# Patient Record
Sex: Male | Born: 1935 | Race: White | Hispanic: No | Marital: Married
Health system: Southern US, Community
[De-identification: ages and names within clinical notes are randomized; demographics above are authoritative.]

---

## 2008-06-22 ENCOUNTER — Ambulatory Visit: Payer: Self-pay | Admitting: Urology

## 2008-06-28 ENCOUNTER — Ambulatory Visit: Payer: Self-pay | Admitting: Urology

## 2008-07-04 ENCOUNTER — Ambulatory Visit: Payer: Self-pay | Admitting: Radiation Oncology

## 2009-09-10 IMAGING — CT CT ABDOMEN AND PELVIS WITHOUT AND WITH CONTRAST
1 of 5 series · 8 of 32 positions shown, 13 images · non-contrast
Comparison: none

REASON FOR EXAM: prostate CA
COMMENTS:

[Series 6: soft tissue delay · axial · delayed · 0.81mm/px · z∈[-155,+245]mm · 8 of 104 slices shown, 13 images]
[im 12/104  soft-tissue]
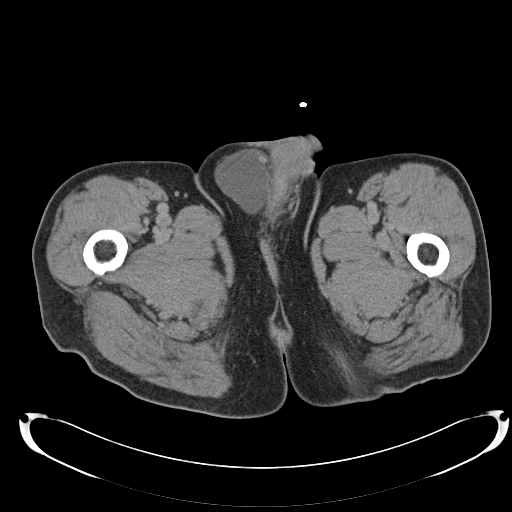
[im 12/104  bone]
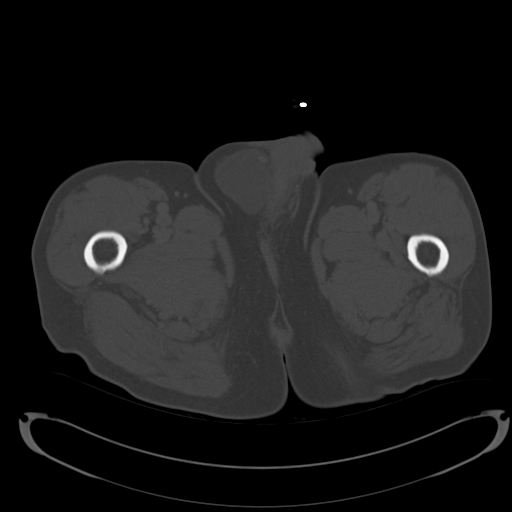
[im 23/104  soft-tissue]
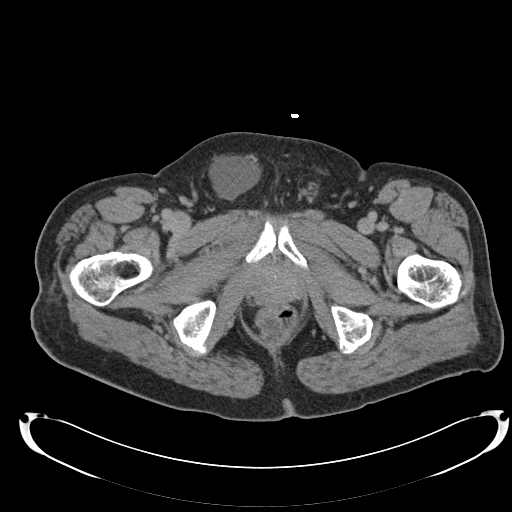
[im 35/104  soft-tissue]
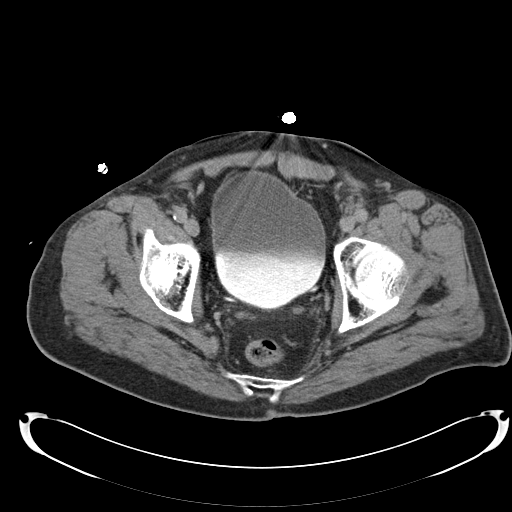
[im 46/104  soft-tissue]
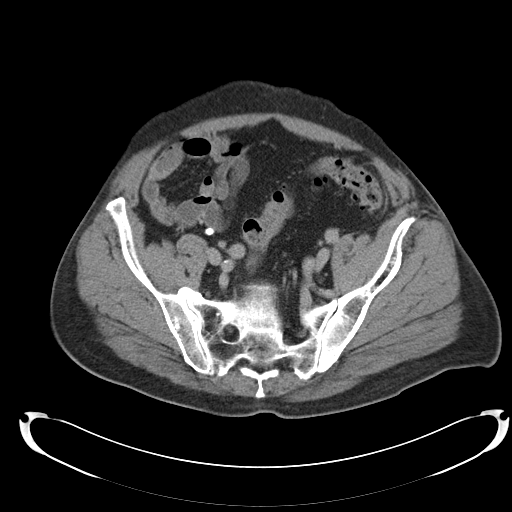
[im 58/104  soft-tissue]
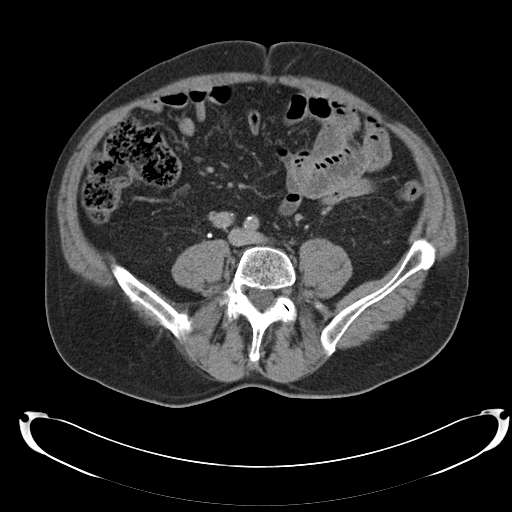
[im 58/104  lung]
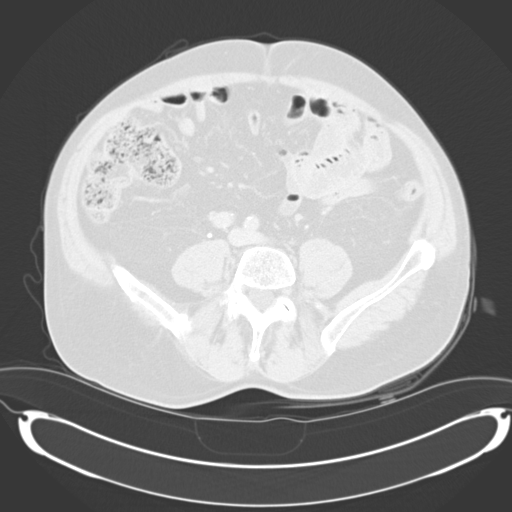
[im 69/104  soft-tissue]
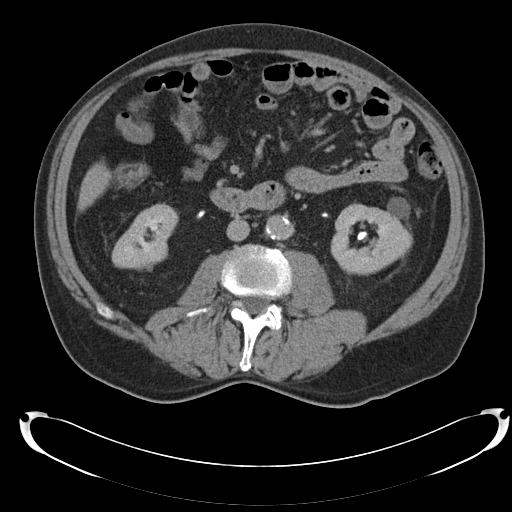
[im 69/104  lung]
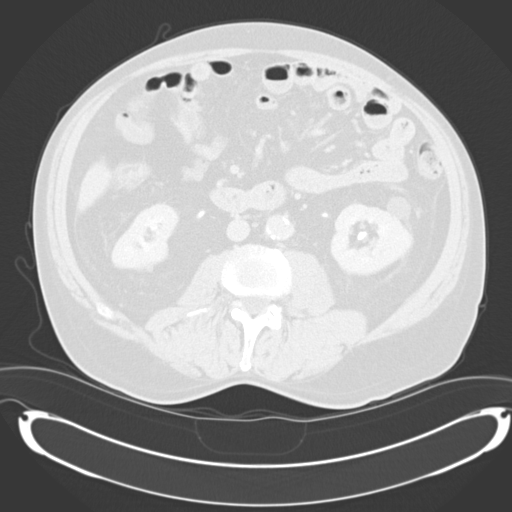
[im 81/104  soft-tissue]
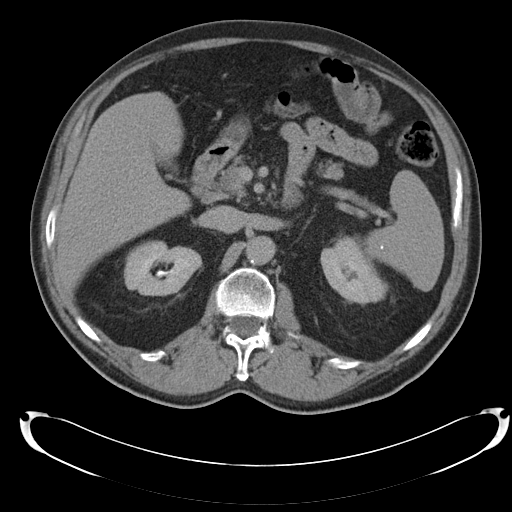
[im 81/104  lung]
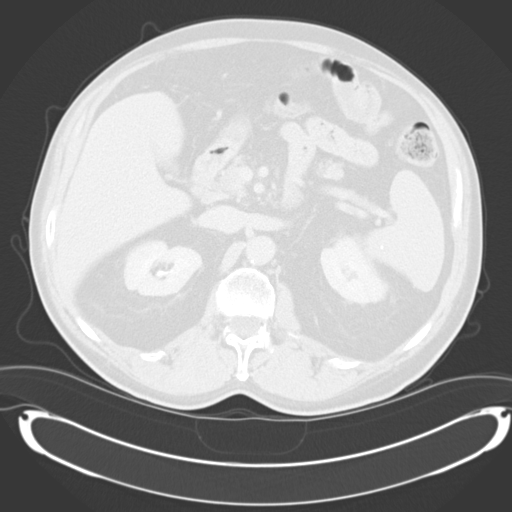
[im 92/104  soft-tissue]
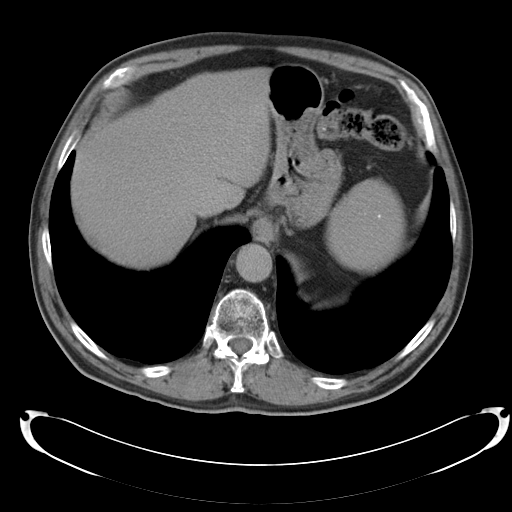
[im 92/104  lung]
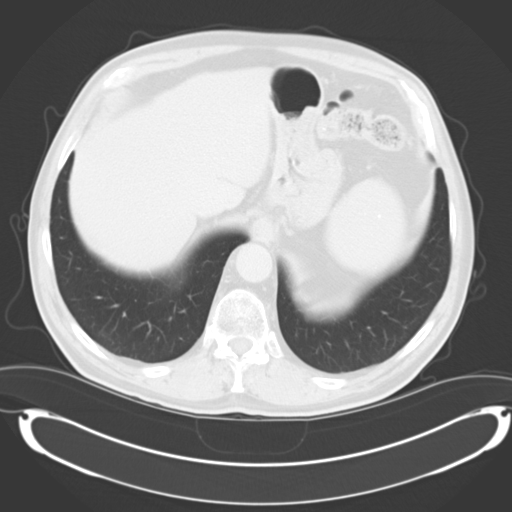

[8 of 32 positions shown; findings below may reference images not displayed]

PROCEDURE:     CT  - CT ABDOMEN / PELVIS  W/WO  - June 22, 2008  [DATE]

RESULT:     Triphasic CT of the abdomen and pelvis is performed. The patient
received a dose of 100 ml of 3sovue-ODM iodinated intravenous contrast for
the exam. Images are reconstructed in the axial plane at 5 mm slice
thickness.

Images through the base of the lungs demonstrate some respiratory motion
artifact. There is mild emphysematous lung disease. There is no mass,
infiltrate or nodule.

Non-contrast images demonstrate multiple punctate granulomatous
calcifications diffusely through the spleen. There appears to be an
exophytic low attenuation area in the midportion of the LEFT kidney
anteriorly. Some perinephric stranding is present bilaterally.
Atherosclerotic calcification is present in the aorta and its branches. The
urinary bladder is unremarkable. Diverticulosis is present. The appendix
appears normal. Following contrast administration the kidneys enhance
symmetrically. There is no obstruction or definite solid mass. There does
appear to be a cyst in the LEFT kidney just below the mid level anteriorly
in an exophytic location measuring up to approximately 2 cm in maximal
diameter. There is no retroperitoneal adenopathy. The aorta is normal in
caliber. There is no evidence of inguinal or pelvic adenopathy. There does
appear to be a moderately large RIGHT hydrocele. No hepatic lesions are
evident. The pancreas is unremarkable. There are no stones in the
gallbladder.

On delayed images both kidneys excrete contrast opacified urine. The ureters
opacify intermittently to the bladder which shows no definite filling
defect. The prostate shows mild enlargement. Calcification is noted in the
prostate.
IMPRESSION: 1. No evidence of adenopathy.
2. Mild emphysematous lung diseases at the lung bases.
3. LEFT renal cyst. The appendix is unremarkable.
4. Diverticulosis without definite CT evidence of acute diverticulitis.
5. RIGHT hydrocele.

## 2013-03-09 ENCOUNTER — Ambulatory Visit: Payer: Self-pay | Admitting: Oncology

## 2013-04-06 ENCOUNTER — Ambulatory Visit: Payer: Self-pay | Admitting: Oncology

## 2013-04-06 LAB — BASIC METABOLIC PANEL WITH GFR
Anion Gap: 9
BUN: 15 mg/dL
Calcium, Total: 8.4 mg/dL — ABNORMAL LOW
Chloride: 101 mmol/L
Co2: 32 mmol/L
Creatinine: 1.12 mg/dL
EGFR (African American): >60
EGFR (Non-African Amer.): >60
Glucose: 94 mg/dL
Osmolality: 284
Potassium: 2.9 mmol/L — ABNORMAL LOW
Sodium: 142 mmol/L

## 2013-04-06 LAB — CBC CANCER CENTER
Basophil #: 0 "x10 3/mm "
Basophil %: 0.5 %
Eosinophil #: 0 "x10 3/mm "
Eosinophil %: 2.1 %
HCT: 38.7 % — ABNORMAL LOW
HGB: 13 g/dL
Lymphocyte %: 21.2 %
Lymphs Abs: 0.5 "x10 3/mm " — ABNORMAL LOW
MCH: 31.7 pg
MCHC: 33.4 g/dL
MCV: 95 fL
Monocyte #: 0.3 "x10 3/mm "
Monocyte %: 12.2 %
Neutrophil #: 1.5 "x10 3/mm "
Neutrophil %: 64 %
Platelet: 133 "x10 3/mm " — ABNORMAL LOW
RBC: 4.09 "x10 6/mm " — ABNORMAL LOW
RDW: 16.7 % — ABNORMAL HIGH
WBC: 2.3 "x10 3/mm " — ABNORMAL LOW

## 2013-04-07 LAB — URINE IEP, RANDOM

## 2013-04-07 LAB — KAPPA/LAMBDA FREE LIGHT CHAINS (ARMC)

## 2013-04-07 LAB — PROT IMMUNOELECTROPHORES(ARMC)

## 2013-05-01 ENCOUNTER — Ambulatory Visit: Payer: Self-pay | Admitting: Oncology

## 2013-05-25 LAB — CBC CANCER CENTER
Basophil #: 0 x10 3/mm (ref 0.0–0.1)
Basophil %: 0.6 %
Eosinophil %: 2.1 %
HCT: 37.3 % — ABNORMAL LOW (ref 40.0–52.0)
HGB: 12.4 g/dL — ABNORMAL LOW (ref 13.0–18.0)
Lymphocyte #: 0.4 x10 3/mm — ABNORMAL LOW (ref 1.0–3.6)
MCHC: 33.3 g/dL (ref 32.0–36.0)
Monocyte #: 0.2 x10 3/mm (ref 0.2–1.0)
Monocyte %: 11.7 %
Neutrophil #: 1.2 x10 3/mm — ABNORMAL LOW (ref 1.4–6.5)
Neutrophil %: 62.3 %
Platelet: 118 x10 3/mm — ABNORMAL LOW (ref 150–440)
WBC: 1.8 x10 3/mm — CL (ref 3.8–10.6)

## 2013-05-25 LAB — BASIC METABOLIC PANEL
Anion Gap: 9 (ref 7–16)
Chloride: 103 mmol/L (ref 98–107)
Co2: 28 mmol/L (ref 21–32)
EGFR (African American): >60
Glucose: 98 mg/dL (ref 65–99)

## 2013-05-26 LAB — KAPPA/LAMBDA FREE LIGHT CHAINS (ARMC)

## 2013-05-26 LAB — PROT IMMUNOELECTROPHORES(ARMC)

## 2013-05-31 ENCOUNTER — Ambulatory Visit: Payer: Self-pay | Admitting: Oncology

## 2013-06-28 LAB — CBC CANCER CENTER
Eosinophil #: 0.1 x10 3/mm (ref 0.0–0.7)
Eosinophil %: 1.7 %
HCT: 35.9 % — ABNORMAL LOW (ref 40.0–52.0)
HGB: 12.2 g/dL — ABNORMAL LOW (ref 13.0–18.0)
Lymphocyte #: 0.4 x10 3/mm — ABNORMAL LOW (ref 1.0–3.6)
Lymphocyte %: 12.3 %
MCHC: 34 g/dL (ref 32.0–36.0)
MCV: 95 fL (ref 80–100)
Monocyte %: 14.7 %
Neutrophil %: 70.8 %
RBC: 3.78 10*6/uL — ABNORMAL LOW (ref 4.40–5.90)
RDW: 15.1 % — ABNORMAL HIGH (ref 11.5–14.5)

## 2013-06-28 LAB — BASIC METABOLIC PANEL
Anion Gap: 2 — ABNORMAL LOW (ref 7–16)
BUN: 19 mg/dL — ABNORMAL HIGH (ref 7–18)
Co2: 30 mmol/L (ref 21–32)
Creatinine: 0.93 mg/dL (ref 0.60–1.30)
EGFR (Non-African Amer.): >60
Glucose: 95 mg/dL (ref 65–99)
Sodium: 139 mmol/L (ref 136–145)

## 2013-06-29 LAB — PROT IMMUNOELECTROPHORES(ARMC)

## 2013-07-01 ENCOUNTER — Ambulatory Visit: Payer: Self-pay | Admitting: Oncology

## 2013-07-20 LAB — CBC CANCER CENTER
BASOS PCT: 1 %
Basophil #: 0 x10 3/mm (ref 0.0–0.1)
Eosinophil #: 0.2 x10 3/mm (ref 0.0–0.7)
Eosinophil %: 12.4 %
HCT: 37 % — AB (ref 40.0–52.0)
HGB: 12.5 g/dL — AB (ref 13.0–18.0)
LYMPHS PCT: 28.6 %
Lymphocyte #: 0.5 x10 3/mm — ABNORMAL LOW (ref 1.0–3.6)
MCH: 32.1 pg (ref 26.0–34.0)
MCHC: 33.8 g/dL (ref 32.0–36.0)
MCV: 95 fL (ref 80–100)
MONO ABS: 0.3 x10 3/mm (ref 0.2–1.0)
MONOS PCT: 17.6 %
NEUTROS PCT: 40.4 %
Neutrophil #: 0.7 x10 3/mm — ABNORMAL LOW (ref 1.4–6.5)
Platelet: 75 x10 3/mm — ABNORMAL LOW (ref 150–440)
RBC: 3.89 10*6/uL — AB (ref 4.40–5.90)
RDW: 15.3 % — AB (ref 11.5–14.5)
WBC: 1.8 x10 3/mm — CL (ref 3.8–10.6)

## 2013-07-20 LAB — BASIC METABOLIC PANEL
Anion Gap: 9 (ref 7–16)
BUN: 12 mg/dL (ref 7–18)
CALCIUM: 7.8 mg/dL — AB (ref 8.5–10.1)
CO2: 29 mmol/L (ref 21–32)
Chloride: 104 mmol/L (ref 98–107)
Creatinine: 0.97 mg/dL (ref 0.60–1.30)
EGFR (Non-African Amer.): >60
GLUCOSE: 82 mg/dL (ref 65–99)
Osmolality: 282 (ref 275–301)
Potassium: 3.2 mmol/L — ABNORMAL LOW (ref 3.5–5.1)
SODIUM: 142 mmol/L (ref 136–145)

## 2013-07-22 LAB — URINE IEP, RANDOM

## 2013-07-22 LAB — PROT IMMUNOELECTROPHORES(ARMC)

## 2013-07-27 LAB — CBC CANCER CENTER
Basophil #: 0 x10 3/mm (ref 0.0–0.1)
Basophil %: 1.2 %
EOS ABS: 0 x10 3/mm (ref 0.0–0.7)
EOS PCT: 2.1 %
HCT: 35.4 % — ABNORMAL LOW (ref 40.0–52.0)
HGB: 11.9 g/dL — ABNORMAL LOW (ref 13.0–18.0)
Lymphocyte #: 0.6 x10 3/mm — ABNORMAL LOW (ref 1.0–3.6)
Lymphocyte %: 45.5 %
MCH: 32.2 pg (ref 26.0–34.0)
MCHC: 33.5 g/dL (ref 32.0–36.0)
MCV: 96 fL (ref 80–100)
Monocyte #: 0.3 x10 3/mm (ref 0.2–1.0)
Monocyte %: 20.1 %
NEUTROS PCT: 31.1 %
Neutrophil #: 0.4 x10 3/mm — ABNORMAL LOW (ref 1.4–6.5)
Platelet: 90 x10 3/mm — ABNORMAL LOW (ref 150–440)
RBC: 3.69 10*6/uL — ABNORMAL LOW (ref 4.40–5.90)
RDW: 15.2 % — ABNORMAL HIGH (ref 11.5–14.5)
WBC: 1.3 x10 3/mm — AB (ref 3.8–10.6)

## 2013-08-01 ENCOUNTER — Ambulatory Visit: Payer: Self-pay | Admitting: Oncology

## 2013-08-25 LAB — BASIC METABOLIC PANEL
Anion Gap: 9 (ref 7–16)
BUN: 17 mg/dL (ref 7–18)
CHLORIDE: 102 mmol/L (ref 98–107)
CREATININE: 1.14 mg/dL (ref 0.60–1.30)
Calcium, Total: 8 mg/dL — ABNORMAL LOW (ref 8.5–10.1)
Co2: 30 mmol/L (ref 21–32)
EGFR (African American): >60
Glucose: 137 mg/dL — ABNORMAL HIGH (ref 65–99)
OSMOLALITY: 285 (ref 275–301)
Potassium: 3.5 mmol/L (ref 3.5–5.1)
SODIUM: 141 mmol/L (ref 136–145)

## 2013-08-25 LAB — CBC CANCER CENTER
Basophil #: 0 x10 3/mm (ref 0.0–0.1)
Basophil %: 0.8 %
EOS PCT: 1 %
Eosinophil #: 0 x10 3/mm (ref 0.0–0.7)
HCT: 40 % (ref 40.0–52.0)
HGB: 13.4 g/dL (ref 13.0–18.0)
LYMPHS PCT: 30.3 %
Lymphocyte #: 0.6 x10 3/mm — ABNORMAL LOW (ref 1.0–3.6)
MCH: 32.1 pg (ref 26.0–34.0)
MCHC: 33.4 g/dL (ref 32.0–36.0)
MCV: 96 fL (ref 80–100)
MONO ABS: 0.4 x10 3/mm (ref 0.2–1.0)
Monocyte %: 20.1 %
Neutrophil #: 0.9 x10 3/mm — ABNORMAL LOW (ref 1.4–6.5)
Neutrophil %: 47.8 %
PLATELETS: 134 x10 3/mm — AB (ref 150–440)
RBC: 4.16 10*6/uL — ABNORMAL LOW (ref 4.40–5.90)
RDW: 15.6 % — AB (ref 11.5–14.5)
WBC: 1.9 x10 3/mm — AB (ref 3.8–10.6)

## 2013-08-27 LAB — URINE IEP, RANDOM

## 2013-08-29 ENCOUNTER — Ambulatory Visit: Payer: Self-pay | Admitting: Oncology

## 2013-08-30 LAB — PROT IMMUNOELECTROPHORES(ARMC)

## 2013-09-29 ENCOUNTER — Ambulatory Visit: Payer: Self-pay | Admitting: Oncology

## 2013-10-21 LAB — CBC CANCER CENTER
BASOS ABS: 0 x10 3/mm (ref 0.0–0.1)
Basophil %: 0.4 %
Eosinophil #: 0 x10 3/mm (ref 0.0–0.7)
Eosinophil %: 0.9 %
HCT: 39.8 % — AB (ref 40.0–52.0)
HGB: 13.3 g/dL (ref 13.0–18.0)
LYMPHS ABS: 0.7 x10 3/mm — AB (ref 1.0–3.6)
LYMPHS PCT: 36.1 %
MCH: 32.2 pg (ref 26.0–34.0)
MCHC: 33.3 g/dL (ref 32.0–36.0)
MCV: 97 fL (ref 80–100)
Monocyte #: 0.3 x10 3/mm (ref 0.2–1.0)
Monocyte %: 14.7 %
Neutrophil #: 0.9 x10 3/mm — ABNORMAL LOW (ref 1.4–6.5)
Neutrophil %: 47.9 %
PLATELETS: 137 x10 3/mm — AB (ref 150–440)
RBC: 4.12 10*6/uL — ABNORMAL LOW (ref 4.40–5.90)
RDW: 15.5 % — ABNORMAL HIGH (ref 11.5–14.5)
WBC: 2 x10 3/mm — CL (ref 3.8–10.6)

## 2013-10-21 LAB — BASIC METABOLIC PANEL
Anion Gap: 9 (ref 7–16)
BUN: 13 mg/dL (ref 7–18)
CALCIUM: 9.2 mg/dL (ref 8.5–10.1)
Chloride: 103 mmol/L (ref 98–107)
Co2: 29 mmol/L (ref 21–32)
Creatinine: 0.95 mg/dL (ref 0.60–1.30)
EGFR (African American): >60
EGFR (Non-African Amer.): >60
Glucose: 71 mg/dL (ref 65–99)
OSMOLALITY: 280 (ref 275–301)
POTASSIUM: 3.5 mmol/L (ref 3.5–5.1)
Sodium: 141 mmol/L (ref 136–145)

## 2013-10-25 LAB — PROT IMMUNOELECTROPHORES(ARMC)

## 2013-10-25 LAB — URINE IEP, RANDOM

## 2013-10-29 ENCOUNTER — Ambulatory Visit: Payer: Self-pay | Admitting: Oncology

## 2013-10-29 LAB — CBC CANCER CENTER
BASOS ABS: 0 x10 3/mm (ref 0.0–0.1)
BASOS PCT: 0.3 %
EOS ABS: 0 x10 3/mm (ref 0.0–0.7)
Eosinophil %: 0.7 %
HCT: 38.3 % — ABNORMAL LOW (ref 40.0–52.0)
HGB: 13 g/dL (ref 13.0–18.0)
LYMPHS ABS: 0.6 x10 3/mm — AB (ref 1.0–3.6)
Lymphocyte %: 32.1 %
MCH: 33 pg (ref 26.0–34.0)
MCHC: 33.9 g/dL (ref 32.0–36.0)
MCV: 97 fL (ref 80–100)
MONOS PCT: 15.4 %
Monocyte #: 0.3 x10 3/mm (ref 0.2–1.0)
NEUTROS ABS: 1 x10 3/mm — AB (ref 1.4–6.5)
Neutrophil %: 51.5 %
PLATELETS: 136 x10 3/mm — AB (ref 150–440)
RBC: 3.93 10*6/uL — AB (ref 4.40–5.90)
RDW: 16.1 % — ABNORMAL HIGH (ref 11.5–14.5)
WBC: 2 x10 3/mm — CL (ref 3.8–10.6)

## 2013-11-06 ENCOUNTER — Emergency Department: Payer: Self-pay | Admitting: Emergency Medicine

## 2013-11-16 LAB — CBC CANCER CENTER
BASOS ABS: 0 x10 3/mm (ref 0.0–0.1)
BASOS PCT: 0.3 %
Eosinophil #: 0 x10 3/mm (ref 0.0–0.7)
Eosinophil %: 0.6 %
HCT: 35.6 % — ABNORMAL LOW (ref 40.0–52.0)
HGB: 12.2 g/dL — AB (ref 13.0–18.0)
Lymphocyte #: 0.8 x10 3/mm — ABNORMAL LOW (ref 1.0–3.6)
Lymphocyte %: 24.2 %
MCH: 33.2 pg (ref 26.0–34.0)
MCHC: 34.3 g/dL (ref 32.0–36.0)
MCV: 97 fL (ref 80–100)
MONO ABS: 0.8 x10 3/mm (ref 0.2–1.0)
Monocyte %: 22.7 %
NEUTROS ABS: 1.8 x10 3/mm (ref 1.4–6.5)
Neutrophil %: 52.2 %
Platelet: 132 x10 3/mm — ABNORMAL LOW (ref 150–440)
RBC: 3.68 10*6/uL — ABNORMAL LOW (ref 4.40–5.90)
RDW: 16.1 % — ABNORMAL HIGH (ref 11.5–14.5)
WBC: 3.4 x10 3/mm — ABNORMAL LOW (ref 3.8–10.6)

## 2013-11-16 LAB — BASIC METABOLIC PANEL
ANION GAP: 9 (ref 7–16)
BUN: 17 mg/dL (ref 7–18)
CALCIUM: 8.6 mg/dL (ref 8.5–10.1)
CO2: 27 mmol/L (ref 21–32)
Chloride: 106 mmol/L (ref 98–107)
Creatinine: 1.05 mg/dL (ref 0.60–1.30)
EGFR (African American): >60
EGFR (Non-African Amer.): >60
Glucose: 91 mg/dL (ref 65–99)
OSMOLALITY: 284 (ref 275–301)
POTASSIUM: 3.7 mmol/L (ref 3.5–5.1)
SODIUM: 142 mmol/L (ref 136–145)

## 2013-11-29 ENCOUNTER — Ambulatory Visit: Payer: Self-pay | Admitting: Oncology

## 2013-12-07 LAB — CBC CANCER CENTER
BASOS ABS: 0 x10 3/mm (ref 0.0–0.1)
BASOS PCT: 0.4 %
Eosinophil #: 0 x10 3/mm (ref 0.0–0.7)
Eosinophil %: 1.3 %
HCT: 39.5 % — AB (ref 40.0–52.0)
HGB: 13.2 g/dL (ref 13.0–18.0)
Lymphocyte #: 0.7 x10 3/mm — ABNORMAL LOW (ref 1.0–3.6)
Lymphocyte %: 19.4 %
MCH: 33.7 pg (ref 26.0–34.0)
MCHC: 33.4 g/dL (ref 32.0–36.0)
MCV: 101 fL — ABNORMAL HIGH (ref 80–100)
MONO ABS: 0.5 x10 3/mm (ref 0.2–1.0)
Monocyte %: 13.7 %
NEUTROS ABS: 2.3 x10 3/mm (ref 1.4–6.5)
Neutrophil %: 65.2 %
Platelet: 166 x10 3/mm (ref 150–440)
RBC: 3.92 10*6/uL — ABNORMAL LOW (ref 4.40–5.90)
RDW: 15.9 % — ABNORMAL HIGH (ref 11.5–14.5)
WBC: 3.6 x10 3/mm — AB (ref 3.8–10.6)

## 2013-12-07 LAB — BASIC METABOLIC PANEL
Anion Gap: 10 (ref 7–16)
BUN: 19 mg/dL — ABNORMAL HIGH (ref 7–18)
CHLORIDE: 101 mmol/L (ref 98–107)
Calcium, Total: 9.2 mg/dL (ref 8.5–10.1)
Co2: 30 mmol/L (ref 21–32)
Creatinine: 1.12 mg/dL (ref 0.60–1.30)
EGFR (African American): >60
EGFR (Non-African Amer.): >60
GLUCOSE: 81 mg/dL (ref 65–99)
OSMOLALITY: 283 (ref 275–301)
Potassium: 3.5 mmol/L (ref 3.5–5.1)
SODIUM: 141 mmol/L (ref 136–145)

## 2013-12-08 LAB — PROT IMMUNOELECTROPHORES(ARMC)

## 2013-12-09 LAB — URINE IEP, RANDOM

## 2013-12-10 LAB — CBC CANCER CENTER
BASOS ABS: 0 x10 3/mm (ref 0.0–0.1)
Basophil %: 0.3 %
Eosinophil #: 0 x10 3/mm (ref 0.0–0.7)
Eosinophil %: 0.9 %
HCT: 39.4 % — ABNORMAL LOW (ref 40.0–52.0)
HGB: 13.4 g/dL (ref 13.0–18.0)
Lymphocyte #: 0.7 x10 3/mm — ABNORMAL LOW (ref 1.0–3.6)
Lymphocyte %: 19.9 %
MCH: 33.9 pg (ref 26.0–34.0)
MCHC: 33.9 g/dL (ref 32.0–36.0)
MCV: 100 fL (ref 80–100)
MONOS PCT: 12.9 %
Monocyte #: 0.4 x10 3/mm (ref 0.2–1.0)
Neutrophil #: 2.2 x10 3/mm (ref 1.4–6.5)
Neutrophil %: 66 %
Platelet: 132 x10 3/mm — ABNORMAL LOW (ref 150–440)
RBC: 3.95 10*6/uL — AB (ref 4.40–5.90)
RDW: 15.3 % — ABNORMAL HIGH (ref 11.5–14.5)
WBC: 3.4 x10 3/mm — AB (ref 3.8–10.6)

## 2013-12-14 LAB — CBC CANCER CENTER
Basophil #: 0 x10 3/mm (ref 0.0–0.1)
Basophil %: 0.3 %
Eosinophil #: 0 x10 3/mm (ref 0.0–0.7)
Eosinophil %: 0.9 %
HCT: 38.9 % — AB (ref 40.0–52.0)
HGB: 13.3 g/dL (ref 13.0–18.0)
Lymphocyte #: 0.6 x10 3/mm — ABNORMAL LOW (ref 1.0–3.6)
Lymphocyte %: 22.5 %
MCH: 33.8 pg (ref 26.0–34.0)
MCHC: 34.3 g/dL (ref 32.0–36.0)
MCV: 99 fL (ref 80–100)
Monocyte #: 0.4 x10 3/mm (ref 0.2–1.0)
Monocyte %: 15.6 %
NEUTROS PCT: 60.7 %
Neutrophil #: 1.7 x10 3/mm (ref 1.4–6.5)
Platelet: 92 x10 3/mm — ABNORMAL LOW (ref 150–440)
RBC: 3.95 10*6/uL — AB (ref 4.40–5.90)
RDW: 15 % — AB (ref 11.5–14.5)
WBC: 2.9 x10 3/mm — AB (ref 3.8–10.6)

## 2013-12-17 LAB — CBC CANCER CENTER
BASOS PCT: 0.3 %
Basophil #: 0 x10 3/mm (ref 0.0–0.1)
EOS PCT: 0.4 %
Eosinophil #: 0 x10 3/mm (ref 0.0–0.7)
HCT: 39.7 % — ABNORMAL LOW (ref 40.0–52.0)
HGB: 13.4 g/dL (ref 13.0–18.0)
LYMPHS ABS: 0.4 x10 3/mm — AB (ref 1.0–3.6)
LYMPHS PCT: 18.3 %
MCH: 33.5 pg (ref 26.0–34.0)
MCHC: 33.8 g/dL (ref 32.0–36.0)
MCV: 99 fL (ref 80–100)
Monocyte #: 0.5 x10 3/mm (ref 0.2–1.0)
Monocyte %: 21.3 %
NEUTROS ABS: 1.4 x10 3/mm (ref 1.4–6.5)
Neutrophil %: 59.7 %
Platelet: 61 x10 3/mm — ABNORMAL LOW (ref 150–440)
RBC: 4 10*6/uL — ABNORMAL LOW (ref 4.40–5.90)
RDW: 14.5 % (ref 11.5–14.5)
WBC: 2.3 x10 3/mm — AB (ref 3.8–10.6)

## 2013-12-21 LAB — CBC CANCER CENTER
Basophil #: 0 x10 3/mm (ref 0.0–0.1)
Basophil %: 0.4 %
EOS PCT: 0.8 %
Eosinophil #: 0 x10 3/mm (ref 0.0–0.7)
HCT: 36.8 % — ABNORMAL LOW (ref 40.0–52.0)
HGB: 12.5 g/dL — ABNORMAL LOW (ref 13.0–18.0)
LYMPHS ABS: 0.8 x10 3/mm — AB (ref 1.0–3.6)
LYMPHS PCT: 20.4 %
MCH: 33.5 pg (ref 26.0–34.0)
MCHC: 33.8 g/dL (ref 32.0–36.0)
MCV: 99 fL (ref 80–100)
MONO ABS: 0.6 x10 3/mm (ref 0.2–1.0)
Monocyte %: 16.2 %
NEUTROS ABS: 2.5 x10 3/mm (ref 1.4–6.5)
NEUTROS PCT: 62.2 %
PLATELETS: 91 x10 3/mm — AB (ref 150–440)
RBC: 3.72 10*6/uL — ABNORMAL LOW (ref 4.40–5.90)
RDW: 14.7 % — ABNORMAL HIGH (ref 11.5–14.5)
WBC: 4 x10 3/mm (ref 3.8–10.6)

## 2013-12-28 LAB — CBC CANCER CENTER
BASOS ABS: 0 x10 3/mm (ref 0.0–0.1)
BASOS PCT: 0.6 %
Eosinophil #: 0 x10 3/mm (ref 0.0–0.7)
Eosinophil %: 0.8 %
HCT: 38.3 % — ABNORMAL LOW (ref 40.0–52.0)
HGB: 13 g/dL (ref 13.0–18.0)
LYMPHS ABS: 0.5 x10 3/mm — AB (ref 1.0–3.6)
LYMPHS PCT: 20.1 %
MCH: 34.2 pg — AB (ref 26.0–34.0)
MCHC: 34 g/dL (ref 32.0–36.0)
MCV: 101 fL — ABNORMAL HIGH (ref 80–100)
Monocyte #: 0.3 x10 3/mm (ref 0.2–1.0)
Monocyte %: 12.6 %
NEUTROS PCT: 65.9 %
Neutrophil #: 1.8 x10 3/mm (ref 1.4–6.5)
Platelet: 158 x10 3/mm (ref 150–440)
RBC: 3.81 10*6/uL — AB (ref 4.40–5.90)
RDW: 14.7 % — ABNORMAL HIGH (ref 11.5–14.5)
WBC: 2.7 x10 3/mm — AB (ref 3.8–10.6)

## 2013-12-28 LAB — BASIC METABOLIC PANEL
Anion Gap: 7 (ref 7–16)
BUN: 19 mg/dL — ABNORMAL HIGH (ref 7–18)
CHLORIDE: 100 mmol/L (ref 98–107)
CO2: 33 mmol/L — AB (ref 21–32)
Calcium, Total: 8.9 mg/dL (ref 8.5–10.1)
Creatinine: 1.22 mg/dL (ref 0.60–1.30)
EGFR (African American): >60
GFR CALC NON AF AMER: 57 — AB
Glucose: 101 mg/dL — ABNORMAL HIGH (ref 65–99)
Osmolality: 282 (ref 275–301)
Potassium: 3.1 mmol/L — ABNORMAL LOW (ref 3.5–5.1)
Sodium: 140 mmol/L (ref 136–145)

## 2013-12-29 ENCOUNTER — Ambulatory Visit: Payer: Self-pay | Admitting: Oncology

## 2013-12-30 LAB — CBC CANCER CENTER
BASOS ABS: 0 x10 3/mm (ref 0.0–0.1)
Basophil %: 0.1 %
EOS ABS: 0 x10 3/mm (ref 0.0–0.7)
EOS PCT: 1.2 %
HCT: 36.1 % — AB (ref 40.0–52.0)
HGB: 12.3 g/dL — AB (ref 13.0–18.0)
LYMPHS ABS: 0.4 x10 3/mm — AB (ref 1.0–3.6)
LYMPHS PCT: 20.4 %
MCH: 34 pg (ref 26.0–34.0)
MCHC: 34.1 g/dL (ref 32.0–36.0)
MCV: 100 fL (ref 80–100)
Monocyte #: 0.4 x10 3/mm (ref 0.2–1.0)
Monocyte %: 20.9 %
NEUTROS ABS: 1.2 x10 3/mm — AB (ref 1.4–6.5)
NEUTROS PCT: 57.4 %
Platelet: 117 x10 3/mm — ABNORMAL LOW (ref 150–440)
RBC: 3.62 10*6/uL — ABNORMAL LOW (ref 4.40–5.90)
RDW: 14.3 % (ref 11.5–14.5)
WBC: 2.1 x10 3/mm — AB (ref 3.8–10.6)

## 2014-01-04 LAB — CBC CANCER CENTER
BASOS ABS: 0 x10 3/mm (ref 0.0–0.1)
BASOS PCT: 0.2 %
EOS PCT: 0.6 %
Eosinophil #: 0 x10 3/mm (ref 0.0–0.7)
HCT: 35.6 % — AB (ref 40.0–52.0)
HGB: 12.2 g/dL — AB (ref 13.0–18.0)
LYMPHS ABS: 0.8 x10 3/mm — AB (ref 1.0–3.6)
Lymphocyte %: 23.4 %
MCH: 33.7 pg (ref 26.0–34.0)
MCHC: 34.4 g/dL (ref 32.0–36.0)
MCV: 98 fL (ref 80–100)
MONOS PCT: 17.9 %
Monocyte #: 0.6 x10 3/mm (ref 0.2–1.0)
Neutrophil #: 1.9 x10 3/mm (ref 1.4–6.5)
Neutrophil %: 57.9 %
Platelet: 83 x10 3/mm — ABNORMAL LOW (ref 150–440)
RBC: 3.63 10*6/uL — ABNORMAL LOW (ref 4.40–5.90)
RDW: 14.2 % (ref 11.5–14.5)
WBC: 3.4 x10 3/mm — AB (ref 3.8–10.6)

## 2014-01-07 LAB — CBC CANCER CENTER
Basophil #: 0 x10 3/mm (ref 0.0–0.1)
Basophil %: 0 %
EOS ABS: 0 x10 3/mm (ref 0.0–0.7)
EOS PCT: 0.8 %
HCT: 35.8 % — ABNORMAL LOW (ref 40.0–52.0)
HGB: 12.1 g/dL — ABNORMAL LOW (ref 13.0–18.0)
Lymphocyte #: 0.4 x10 3/mm — ABNORMAL LOW (ref 1.0–3.6)
Lymphocyte %: 18.7 %
MCH: 33.7 pg (ref 26.0–34.0)
MCHC: 33.9 g/dL (ref 32.0–36.0)
MCV: 99 fL (ref 80–100)
Monocyte #: 0.5 x10 3/mm (ref 0.2–1.0)
Monocyte %: 23.2 %
NEUTROS ABS: 1.3 x10 3/mm — AB (ref 1.4–6.5)
Neutrophil %: 57.3 %
PLATELETS: 63 x10 3/mm — AB (ref 150–440)
RBC: 3.6 10*6/uL — ABNORMAL LOW (ref 4.40–5.90)
RDW: 14.3 % (ref 11.5–14.5)
WBC: 2.3 x10 3/mm — AB (ref 3.8–10.6)

## 2014-01-11 LAB — COMPREHENSIVE METABOLIC PANEL
ALT: 23 U/L (ref 12–78)
ANION GAP: 11 (ref 7–16)
AST: 18 U/L (ref 15–37)
Albumin: 2.9 g/dL — ABNORMAL LOW (ref 3.4–5.0)
Alkaline Phosphatase: 40 U/L — ABNORMAL LOW
BUN: 27 mg/dL — AB (ref 7–18)
Bilirubin,Total: 0.3 mg/dL (ref 0.2–1.0)
CREATININE: 1.49 mg/dL — AB (ref 0.60–1.30)
Calcium, Total: 8.7 mg/dL (ref 8.5–10.1)
Chloride: 101 mmol/L (ref 98–107)
Co2: 27 mmol/L (ref 21–32)
EGFR (African American): 52 — ABNORMAL LOW
GFR CALC NON AF AMER: 45 — AB
Glucose: 71 mg/dL (ref 65–99)
Osmolality: 281 (ref 275–301)
POTASSIUM: 3.9 mmol/L (ref 3.5–5.1)
Sodium: 139 mmol/L (ref 136–145)
Total Protein: 8.6 g/dL — ABNORMAL HIGH (ref 6.4–8.2)

## 2014-01-11 LAB — CBC CANCER CENTER
BASOS PCT: 0.4 %
Basophil #: 0 x10 3/mm (ref 0.0–0.1)
EOS PCT: 0.8 %
Eosinophil #: 0 x10 3/mm (ref 0.0–0.7)
HCT: 34.6 % — ABNORMAL LOW (ref 40.0–52.0)
HGB: 12 g/dL — ABNORMAL LOW (ref 13.0–18.0)
Lymphocyte #: 0.6 x10 3/mm — ABNORMAL LOW (ref 1.0–3.6)
Lymphocyte %: 19.8 %
MCH: 33.9 pg (ref 26.0–34.0)
MCHC: 34.5 g/dL (ref 32.0–36.0)
MCV: 98 fL (ref 80–100)
MONOS PCT: 14.4 %
Monocyte #: 0.5 x10 3/mm (ref 0.2–1.0)
NEUTROS ABS: 2 x10 3/mm (ref 1.4–6.5)
Neutrophil %: 64.6 %
PLATELETS: 63 x10 3/mm — AB (ref 150–440)
RBC: 3.53 10*6/uL — AB (ref 4.40–5.90)
RDW: 14.2 % (ref 11.5–14.5)
WBC: 3.1 x10 3/mm — ABNORMAL LOW (ref 3.8–10.6)

## 2014-01-13 LAB — KAPPA/LAMBDA FREE LIGHT CHAINS (ARMC)

## 2014-01-13 LAB — PROT IMMUNOELECTROPHORES(ARMC)

## 2014-01-29 ENCOUNTER — Ambulatory Visit: Payer: Self-pay | Admitting: Oncology

## 2014-02-14 LAB — BASIC METABOLIC PANEL
Anion Gap: 9 (ref 7–16)
BUN: 16 mg/dL (ref 7–18)
CALCIUM: 8.5 mg/dL (ref 8.5–10.1)
CHLORIDE: 101 mmol/L (ref 98–107)
Co2: 29 mmol/L (ref 21–32)
Creatinine: 1.11 mg/dL (ref 0.60–1.30)
EGFR (African American): >60
GLUCOSE: 100 mg/dL — AB (ref 65–99)
Osmolality: 279 (ref 275–301)
POTASSIUM: 3.8 mmol/L (ref 3.5–5.1)
Sodium: 139 mmol/L (ref 136–145)

## 2014-02-14 LAB — CBC CANCER CENTER
BASOS ABS: 0 x10 3/mm (ref 0.0–0.1)
BASOS PCT: 0.8 %
EOS PCT: 1 %
Eosinophil #: 0 x10 3/mm (ref 0.0–0.7)
HCT: 35.9 % — ABNORMAL LOW (ref 40.0–52.0)
HGB: 12 g/dL — ABNORMAL LOW (ref 13.0–18.0)
LYMPHS PCT: 21.3 %
Lymphocyte #: 0.7 x10 3/mm — ABNORMAL LOW (ref 1.0–3.6)
MCH: 33.7 pg (ref 26.0–34.0)
MCHC: 33.3 g/dL (ref 32.0–36.0)
MCV: 101 fL — AB (ref 80–100)
Monocyte #: 0.7 x10 3/mm (ref 0.2–1.0)
Monocyte %: 22.2 %
Neutrophil #: 1.7 x10 3/mm (ref 1.4–6.5)
Neutrophil %: 54.7 %
Platelet: 134 x10 3/mm — ABNORMAL LOW (ref 150–440)
RBC: 3.55 10*6/uL — AB (ref 4.40–5.90)
RDW: 14 % (ref 11.5–14.5)
WBC: 3.1 x10 3/mm — ABNORMAL LOW (ref 3.8–10.6)

## 2014-02-15 LAB — URINE IEP, RANDOM

## 2014-02-16 LAB — PROT IMMUNOELECTROPHORES(ARMC)

## 2014-02-21 LAB — CBC CANCER CENTER
BASOS ABS: 0 x10 3/mm (ref 0.0–0.1)
Basophil %: 0.4 %
EOS PCT: 0.7 %
Eosinophil #: 0 x10 3/mm (ref 0.0–0.7)
HCT: 36.5 % — ABNORMAL LOW (ref 40.0–52.0)
HGB: 12.2 g/dL — AB (ref 13.0–18.0)
LYMPHS ABS: 0.6 x10 3/mm — AB (ref 1.0–3.6)
Lymphocyte %: 30.4 %
MCH: 33.9 pg (ref 26.0–34.0)
MCHC: 33.5 g/dL (ref 32.0–36.0)
MCV: 101 fL — AB (ref 80–100)
Monocyte #: 0.3 x10 3/mm (ref 0.2–1.0)
Monocyte %: 13.6 %
NEUTROS PCT: 54.9 %
Neutrophil #: 1.1 x10 3/mm — ABNORMAL LOW (ref 1.4–6.5)
Platelet: 118 x10 3/mm — ABNORMAL LOW (ref 150–440)
RBC: 3.61 10*6/uL — ABNORMAL LOW (ref 4.40–5.90)
RDW: 14.4 % (ref 11.5–14.5)
WBC: 2.1 x10 3/mm — AB (ref 3.8–10.6)

## 2014-02-21 LAB — COMPREHENSIVE METABOLIC PANEL
ALBUMIN: 2.7 g/dL — AB (ref 3.4–5.0)
Alkaline Phosphatase: 42 U/L — ABNORMAL LOW
Anion Gap: 13 (ref 7–16)
BILIRUBIN TOTAL: 0.5 mg/dL (ref 0.2–1.0)
BUN: 13 mg/dL (ref 7–18)
Calcium, Total: 9 mg/dL (ref 8.5–10.1)
Chloride: 101 mmol/L (ref 98–107)
Co2: 25 mmol/L (ref 21–32)
Creatinine: 1.17 mg/dL (ref 0.60–1.30)
EGFR (African American): >60
EGFR (Non-African Amer.): 60 — ABNORMAL LOW
Glucose: 106 mg/dL — ABNORMAL HIGH (ref 65–99)
OSMOLALITY: 278 (ref 275–301)
POTASSIUM: 3.9 mmol/L (ref 3.5–5.1)
SGOT(AST): 13 U/L — ABNORMAL LOW (ref 15–37)
SGPT (ALT): 23 U/L
SODIUM: 139 mmol/L (ref 136–145)
Total Protein: 9 g/dL — ABNORMAL HIGH (ref 6.4–8.2)

## 2014-02-21 LAB — MAGNESIUM: MAGNESIUM: 1.9 mg/dL

## 2014-02-28 LAB — CBC CANCER CENTER
BASOS PCT: 0.2 %
Basophil #: 0 x10 3/mm (ref 0.0–0.1)
Eosinophil #: 0 x10 3/mm (ref 0.0–0.7)
Eosinophil %: 0.7 %
HCT: 34.5 % — AB (ref 40.0–52.0)
HGB: 11.7 g/dL — ABNORMAL LOW (ref 13.0–18.0)
LYMPHS ABS: 0.7 x10 3/mm — AB (ref 1.0–3.6)
Lymphocyte %: 26.3 %
MCH: 34.1 pg — AB (ref 26.0–34.0)
MCHC: 33.8 g/dL (ref 32.0–36.0)
MCV: 101 fL — AB (ref 80–100)
MONOS PCT: 12.5 %
Monocyte #: 0.3 x10 3/mm (ref 0.2–1.0)
NEUTROS ABS: 1.6 x10 3/mm (ref 1.4–6.5)
NEUTROS PCT: 60.3 %
Platelet: 131 x10 3/mm — ABNORMAL LOW (ref 150–440)
RBC: 3.42 10*6/uL — ABNORMAL LOW (ref 4.40–5.90)
RDW: 14.6 % — ABNORMAL HIGH (ref 11.5–14.5)
WBC: 2.6 x10 3/mm — ABNORMAL LOW (ref 3.8–10.6)

## 2014-02-28 LAB — COMPREHENSIVE METABOLIC PANEL
ALBUMIN: 2.7 g/dL — AB (ref 3.4–5.0)
ALK PHOS: 41 U/L — AB
Anion Gap: 10 (ref 7–16)
BUN: 19 mg/dL — ABNORMAL HIGH (ref 7–18)
Bilirubin,Total: 0.5 mg/dL (ref 0.2–1.0)
CHLORIDE: 103 mmol/L (ref 98–107)
Calcium, Total: 8.8 mg/dL (ref 8.5–10.1)
Co2: 28 mmol/L (ref 21–32)
Creatinine: 1.17 mg/dL (ref 0.60–1.30)
EGFR (African American): >60
EGFR (Non-African Amer.): 60 — ABNORMAL LOW
Glucose: 90 mg/dL (ref 65–99)
Osmolality: 283 (ref 275–301)
Potassium: 3.9 mmol/L (ref 3.5–5.1)
SGOT(AST): 11 U/L — ABNORMAL LOW (ref 15–37)
SGPT (ALT): 17 U/L
SODIUM: 141 mmol/L (ref 136–145)
Total Protein: 8.7 g/dL — ABNORMAL HIGH (ref 6.4–8.2)

## 2014-02-28 LAB — MAGNESIUM: Magnesium: 1.9 mg/dL

## 2014-03-01 ENCOUNTER — Ambulatory Visit: Payer: Self-pay | Admitting: Oncology

## 2014-03-02 ENCOUNTER — Ambulatory Visit: Payer: Self-pay | Admitting: Vascular Surgery

## 2014-03-14 LAB — CBC CANCER CENTER
BASOS ABS: 0 x10 3/mm (ref 0.0–0.1)
Basophil %: 0.5 %
EOS ABS: 0 x10 3/mm (ref 0.0–0.7)
Eosinophil %: 0.8 %
HCT: 34.1 % — ABNORMAL LOW (ref 40.0–52.0)
HGB: 11.7 g/dL — ABNORMAL LOW (ref 13.0–18.0)
LYMPHS PCT: 26.1 %
Lymphocyte #: 0.7 x10 3/mm — ABNORMAL LOW (ref 1.0–3.6)
MCH: 34.7 pg — AB (ref 26.0–34.0)
MCHC: 34.3 g/dL (ref 32.0–36.0)
MCV: 101 fL — ABNORMAL HIGH (ref 80–100)
MONO ABS: 0.3 x10 3/mm (ref 0.2–1.0)
Monocyte %: 11.2 %
NEUTROS ABS: 1.7 x10 3/mm (ref 1.4–6.5)
NEUTROS PCT: 61.4 %
Platelet: 149 x10 3/mm — ABNORMAL LOW (ref 150–440)
RBC: 3.37 10*6/uL — AB (ref 4.40–5.90)
RDW: 15.3 % — AB (ref 11.5–14.5)
WBC: 2.8 x10 3/mm — ABNORMAL LOW (ref 3.8–10.6)

## 2014-03-14 LAB — COMPREHENSIVE METABOLIC PANEL
ALBUMIN: 2.8 g/dL — AB (ref 3.4–5.0)
AST: 14 U/L — AB (ref 15–37)
Alkaline Phosphatase: 47 U/L
Anion Gap: 9 (ref 7–16)
BUN: 16 mg/dL (ref 7–18)
Bilirubin,Total: 0.6 mg/dL (ref 0.2–1.0)
CHLORIDE: 99 mmol/L (ref 98–107)
CREATININE: 1.16 mg/dL (ref 0.60–1.30)
Calcium, Total: 9.2 mg/dL (ref 8.5–10.1)
Co2: 29 mmol/L (ref 21–32)
EGFR (African American): >60
EGFR (Non-African Amer.): >60
Glucose: 97 mg/dL (ref 65–99)
OSMOLALITY: 275 (ref 275–301)
POTASSIUM: 3.5 mmol/L (ref 3.5–5.1)
SGPT (ALT): 17 U/L
Sodium: 137 mmol/L (ref 136–145)
TOTAL PROTEIN: 9.5 g/dL — AB (ref 6.4–8.2)

## 2014-03-14 LAB — MAGNESIUM: Magnesium: 1.9 mg/dL

## 2014-03-16 LAB — URINE IEP, RANDOM

## 2014-03-17 LAB — PROT IMMUNOELECTROPHORES(ARMC)

## 2014-03-21 LAB — BASIC METABOLIC PANEL
Anion Gap: 6 — ABNORMAL LOW (ref 7–16)
BUN: 15 mg/dL (ref 7–18)
CHLORIDE: 105 mmol/L (ref 98–107)
CO2: 28 mmol/L (ref 21–32)
Calcium, Total: 9.1 mg/dL (ref 8.5–10.1)
Creatinine: 1.14 mg/dL (ref 0.60–1.30)
GLUCOSE: 100 mg/dL — AB (ref 65–99)
Osmolality: 278 (ref 275–301)
Potassium: 3.8 mmol/L (ref 3.5–5.1)
Sodium: 139 mmol/L (ref 136–145)

## 2014-03-21 LAB — CBC CANCER CENTER
BASOS PCT: 0.4 %
Basophil #: 0 x10 3/mm (ref 0.0–0.1)
Eosinophil #: 0 x10 3/mm (ref 0.0–0.7)
Eosinophil %: 1 %
HCT: 31.6 % — AB (ref 40.0–52.0)
HGB: 10.8 g/dL — ABNORMAL LOW (ref 13.0–18.0)
Lymphocyte #: 0.6 x10 3/mm — ABNORMAL LOW (ref 1.0–3.6)
Lymphocyte %: 24.9 %
MCH: 34.8 pg — ABNORMAL HIGH (ref 26.0–34.0)
MCHC: 34.1 g/dL (ref 32.0–36.0)
MCV: 102 fL — AB (ref 80–100)
MONOS PCT: 16.9 %
Monocyte #: 0.4 x10 3/mm (ref 0.2–1.0)
NEUTROS ABS: 1.4 x10 3/mm (ref 1.4–6.5)
Neutrophil %: 56.8 %
PLATELETS: 116 x10 3/mm — AB (ref 150–440)
RBC: 3.1 10*6/uL — AB (ref 4.40–5.90)
RDW: 15.3 % — ABNORMAL HIGH (ref 11.5–14.5)
WBC: 2.5 x10 3/mm — AB (ref 3.8–10.6)

## 2014-03-31 ENCOUNTER — Ambulatory Visit: Payer: Self-pay | Admitting: Oncology

## 2014-03-31 LAB — BASIC METABOLIC PANEL
ANION GAP: 10 (ref 7–16)
BUN: 17 mg/dL (ref 7–18)
CALCIUM: 8.7 mg/dL (ref 8.5–10.1)
CHLORIDE: 104 mmol/L (ref 98–107)
CREATININE: 1.09 mg/dL (ref 0.60–1.30)
Co2: 25 mmol/L (ref 21–32)
EGFR (African American): >60
EGFR (Non-African Amer.): >60
GLUCOSE: 89 mg/dL (ref 65–99)
Osmolality: 279 (ref 275–301)
Potassium: 3.7 mmol/L (ref 3.5–5.1)
Sodium: 139 mmol/L (ref 136–145)

## 2014-03-31 LAB — CBC CANCER CENTER
BASOS ABS: 0 x10 3/mm (ref 0.0–0.1)
Basophil %: 0.4 %
EOS PCT: 0.9 %
Eosinophil #: 0 x10 3/mm (ref 0.0–0.7)
HCT: 31.5 % — ABNORMAL LOW (ref 40.0–52.0)
HGB: 10.8 g/dL — ABNORMAL LOW (ref 13.0–18.0)
Lymphocyte #: 0.7 x10 3/mm — ABNORMAL LOW (ref 1.0–3.6)
Lymphocyte %: 20.8 %
MCH: 35.7 pg — AB (ref 26.0–34.0)
MCHC: 34.3 g/dL (ref 32.0–36.0)
MCV: 104 fL — AB (ref 80–100)
MONO ABS: 0.4 x10 3/mm (ref 0.2–1.0)
Monocyte %: 11.1 %
Neutrophil #: 2.1 x10 3/mm (ref 1.4–6.5)
Neutrophil %: 66.8 %
Platelet: 138 x10 3/mm — ABNORMAL LOW (ref 150–440)
RBC: 3.03 10*6/uL — AB (ref 4.40–5.90)
RDW: 15.7 % — ABNORMAL HIGH (ref 11.5–14.5)
WBC: 3.2 x10 3/mm — ABNORMAL LOW (ref 3.8–10.6)

## 2014-04-04 LAB — URINALYSIS, COMPLETE
Bilirubin,UR: NEGATIVE
Blood: NEGATIVE
Glucose,UR: >=500 mg/dL (ref 0–75)
KETONE: NEGATIVE
LEUKOCYTE ESTERASE: NEGATIVE
Nitrite: NEGATIVE
PROTEIN: NEGATIVE
Ph: 5 (ref 4.5–8.0)
SPECIFIC GRAVITY: 1.012 (ref 1.003–1.030)
WBC UR: <1 /HPF (ref 0–5)

## 2014-04-04 LAB — CBC CANCER CENTER
Basophil #: 0 x10 3/mm (ref 0.0–0.1)
Basophil %: 0.5 %
EOS PCT: 0.4 %
Eosinophil #: 0 x10 3/mm (ref 0.0–0.7)
HCT: 32 % — ABNORMAL LOW (ref 40.0–52.0)
HGB: 10.7 g/dL — ABNORMAL LOW (ref 13.0–18.0)
LYMPHS ABS: 0.4 x10 3/mm — AB (ref 1.0–3.6)
Lymphocyte %: 6.4 %
MCH: 35.3 pg — ABNORMAL HIGH (ref 26.0–34.0)
MCHC: 33.5 g/dL (ref 32.0–36.0)
MCV: 106 fL — AB (ref 80–100)
Monocyte #: 0.3 x10 3/mm (ref 0.2–1.0)
Monocyte %: 4.2 %
Neutrophil #: 5.4 x10 3/mm (ref 1.4–6.5)
Neutrophil %: 88.5 %
Platelet: 124 x10 3/mm — ABNORMAL LOW (ref 150–440)
RBC: 3.03 10*6/uL — ABNORMAL LOW (ref 4.40–5.90)
RDW: 16 % — ABNORMAL HIGH (ref 11.5–14.5)
WBC: 6.1 x10 3/mm (ref 3.8–10.6)

## 2014-04-04 LAB — BASIC METABOLIC PANEL
ANION GAP: 12 (ref 7–16)
BUN: 24 mg/dL — AB (ref 7–18)
CALCIUM: 8.7 mg/dL (ref 8.5–10.1)
CREATININE: 1.16 mg/dL (ref 0.60–1.30)
Chloride: 105 mmol/L (ref 98–107)
Co2: 23 mmol/L (ref 21–32)
EGFR (African American): >60
EGFR (Non-African Amer.): >60
Glucose: 112 mg/dL — ABNORMAL HIGH (ref 65–99)
OSMOLALITY: 284 (ref 275–301)
POTASSIUM: 3.8 mmol/L (ref 3.5–5.1)
Sodium: 140 mmol/L (ref 136–145)

## 2014-04-04 LAB — MAGNESIUM: MAGNESIUM: 2.2 mg/dL

## 2014-04-14 LAB — COMPREHENSIVE METABOLIC PANEL
ALBUMIN: 2.6 g/dL — AB (ref 3.4–5.0)
Alkaline Phosphatase: 44 U/L — ABNORMAL LOW
Anion Gap: 10 (ref 7–16)
BUN: 21 mg/dL — ABNORMAL HIGH (ref 7–18)
Bilirubin,Total: 0.5 mg/dL (ref 0.2–1.0)
CO2: 26 mmol/L (ref 21–32)
Calcium, Total: 8.8 mg/dL (ref 8.5–10.1)
Chloride: 103 mmol/L (ref 98–107)
Creatinine: 1.13 mg/dL (ref 0.60–1.30)
EGFR (African American): >60
Glucose: 77 mg/dL (ref 65–99)
OSMOLALITY: 279 (ref 275–301)
Potassium: 3.6 mmol/L (ref 3.5–5.1)
SGOT(AST): 21 U/L (ref 15–37)
SGPT (ALT): 50 U/L
SODIUM: 139 mmol/L (ref 136–145)
TOTAL PROTEIN: 9 g/dL — AB (ref 6.4–8.2)

## 2014-04-14 LAB — CBC CANCER CENTER
Basophil #: 0.1 x10 3/mm (ref 0.0–0.1)
Basophil %: 1 %
EOS PCT: 0.5 %
Eosinophil #: 0 x10 3/mm (ref 0.0–0.7)
HCT: 33.8 % — ABNORMAL LOW (ref 40.0–52.0)
HGB: 11.2 g/dL — AB (ref 13.0–18.0)
Lymphocyte #: 0.7 x10 3/mm — ABNORMAL LOW (ref 1.0–3.6)
Lymphocyte %: 14.9 %
MCH: 35.5 pg — AB (ref 26.0–34.0)
MCHC: 33.2 g/dL (ref 32.0–36.0)
MCV: 107 fL — ABNORMAL HIGH (ref 80–100)
MONOS PCT: 7.5 %
Monocyte #: 0.4 x10 3/mm (ref 0.2–1.0)
NEUTROS ABS: 3.7 x10 3/mm (ref 1.4–6.5)
Neutrophil %: 76.1 %
PLATELETS: 127 x10 3/mm — AB (ref 150–440)
RBC: 3.16 10*6/uL — ABNORMAL LOW (ref 4.40–5.90)
RDW: 15.1 % — AB (ref 11.5–14.5)
WBC: 4.9 x10 3/mm (ref 3.8–10.6)

## 2014-04-15 LAB — PROT IMMUNOELECTROPHORES(ARMC)

## 2014-04-21 LAB — CBC CANCER CENTER
BASOS PCT: 0.3 %
Basophil #: 0 x10 3/mm (ref 0.0–0.1)
EOS ABS: 0 x10 3/mm (ref 0.0–0.7)
EOS PCT: 0.8 %
HCT: 31.2 % — ABNORMAL LOW (ref 40.0–52.0)
HGB: 10 g/dL — AB (ref 13.0–18.0)
LYMPHS PCT: 17.6 %
Lymphocyte #: 0.4 x10 3/mm — ABNORMAL LOW (ref 1.0–3.6)
MCH: 34.9 pg — ABNORMAL HIGH (ref 26.0–34.0)
MCHC: 32.2 g/dL (ref 32.0–36.0)
MCV: 109 fL — ABNORMAL HIGH (ref 80–100)
MONO ABS: 0.2 x10 3/mm (ref 0.2–1.0)
Monocyte %: 9.3 %
Neutrophil #: 1.8 x10 3/mm (ref 1.4–6.5)
Neutrophil %: 72 %
Platelet: 84 x10 3/mm — ABNORMAL LOW (ref 150–440)
RBC: 2.87 10*6/uL — ABNORMAL LOW (ref 4.40–5.90)
RDW: 14.5 % (ref 11.5–14.5)
WBC: 2.4 x10 3/mm — ABNORMAL LOW (ref 3.8–10.6)

## 2014-04-21 LAB — COMPREHENSIVE METABOLIC PANEL
ALK PHOS: 44 U/L — AB
ALT: 29 U/L
ANION GAP: 10 (ref 7–16)
Albumin: 2.2 g/dL — ABNORMAL LOW (ref 3.4–5.0)
BUN: 18 mg/dL (ref 7–18)
Bilirubin,Total: 0.4 mg/dL (ref 0.2–1.0)
CALCIUM: 8.5 mg/dL (ref 8.5–10.1)
CHLORIDE: 106 mmol/L (ref 98–107)
Co2: 26 mmol/L (ref 21–32)
Creatinine: 1.23 mg/dL (ref 0.60–1.30)
EGFR (African American): >60
Glucose: 100 mg/dL — ABNORMAL HIGH (ref 65–99)
OSMOLALITY: 285 (ref 275–301)
Potassium: 3.8 mmol/L (ref 3.5–5.1)
SGOT(AST): 14 U/L — ABNORMAL LOW (ref 15–37)
Sodium: 142 mmol/L (ref 136–145)
Total Protein: 8.9 g/dL — ABNORMAL HIGH (ref 6.4–8.2)

## 2014-04-25 LAB — PROT IMMUNOELECTROPHORES(ARMC)

## 2014-05-01 ENCOUNTER — Ambulatory Visit: Payer: Self-pay | Admitting: Oncology

## 2014-05-16 LAB — CBC CANCER CENTER
BASOS ABS: 0 x10 3/mm (ref 0.0–0.1)
Basophil %: 0.5 %
Eosinophil #: 0 x10 3/mm (ref 0.0–0.7)
Eosinophil %: 0.4 %
HCT: 32.5 % — ABNORMAL LOW (ref 40.0–52.0)
HGB: 10.8 g/dL — AB (ref 13.0–18.0)
Lymphocyte #: 0.3 x10 3/mm — ABNORMAL LOW (ref 1.0–3.6)
Lymphocyte %: 8 %
MCH: 35.9 pg — AB (ref 26.0–34.0)
MCHC: 33.3 g/dL (ref 32.0–36.0)
MCV: 108 fL — ABNORMAL HIGH (ref 80–100)
MONO ABS: 0.2 x10 3/mm (ref 0.2–1.0)
Monocyte %: 4.1 %
Neutrophil #: 3.8 x10 3/mm (ref 1.4–6.5)
Neutrophil %: 87 %
Platelet: 109 x10 3/mm — ABNORMAL LOW (ref 150–440)
RBC: 3.01 10*6/uL — AB (ref 4.40–5.90)
RDW: 14.8 % — ABNORMAL HIGH (ref 11.5–14.5)
WBC: 4.4 x10 3/mm (ref 3.8–10.6)

## 2014-05-16 LAB — COMPREHENSIVE METABOLIC PANEL
Albumin: 2 g/dL — ABNORMAL LOW (ref 3.4–5.0)
Alkaline Phosphatase: 41 U/L — ABNORMAL LOW
Anion Gap: 9 (ref 7–16)
BUN: 21 mg/dL — ABNORMAL HIGH (ref 7–18)
Bilirubin,Total: 0.4 mg/dL (ref 0.2–1.0)
CO2: 28 mmol/L (ref 21–32)
Calcium, Total: 8.9 mg/dL (ref 8.5–10.1)
Chloride: 102 mmol/L (ref 98–107)
Creatinine: 1.05 mg/dL (ref 0.60–1.30)
EGFR (African American): >60
Glucose: 117 mg/dL — ABNORMAL HIGH (ref 65–99)
Osmolality: 282 (ref 275–301)
POTASSIUM: 4.3 mmol/L (ref 3.5–5.1)
SGOT(AST): 24 U/L (ref 15–37)
SGPT (ALT): 64 U/L — ABNORMAL HIGH
SODIUM: 139 mmol/L (ref 136–145)
TOTAL PROTEIN: 9.6 g/dL — AB (ref 6.4–8.2)

## 2014-05-17 LAB — KAPPA/LAMBDA FREE LIGHT CHAINS (ARMC)

## 2014-05-17 LAB — PROT IMMUNOELECTROPHORES(ARMC)

## 2014-05-24 LAB — COMPREHENSIVE METABOLIC PANEL
ALBUMIN: 2 g/dL — AB (ref 3.4–5.0)
Alkaline Phosphatase: 43 U/L — ABNORMAL LOW
Anion Gap: 9 (ref 7–16)
BUN: 22 mg/dL — ABNORMAL HIGH (ref 7–18)
Bilirubin,Total: 0.6 mg/dL (ref 0.2–1.0)
Calcium, Total: 9 mg/dL (ref 8.5–10.1)
Chloride: 93 mmol/L — ABNORMAL LOW (ref 98–107)
Co2: 28 mmol/L (ref 21–32)
Creatinine: 1.22 mg/dL (ref 0.60–1.30)
EGFR (African American): >60
GLUCOSE: 83 mg/dL (ref 65–99)
OSMOLALITY: 263 (ref 275–301)
POTASSIUM: 3.5 mmol/L (ref 3.5–5.1)
SGOT(AST): 15 U/L (ref 15–37)
SGPT (ALT): 38 U/L
Sodium: 130 mmol/L — ABNORMAL LOW (ref 136–145)
Total Protein: 9.9 g/dL — ABNORMAL HIGH (ref 6.4–8.2)

## 2014-05-24 LAB — CBC CANCER CENTER
Basophil #: 0 x10 3/mm (ref 0.0–0.1)
Basophil %: 0.5 %
Eosinophil #: 0.1 x10 3/mm (ref 0.0–0.7)
Eosinophil %: 1.7 %
HCT: 34.1 % — AB (ref 40.0–52.0)
HGB: 11.5 g/dL — ABNORMAL LOW (ref 13.0–18.0)
Lymphocyte #: 0.1 x10 3/mm — ABNORMAL LOW (ref 1.0–3.6)
Lymphocyte %: 3.8 %
MCH: 35.7 pg — ABNORMAL HIGH (ref 26.0–34.0)
MCHC: 33.7 g/dL (ref 32.0–36.0)
MCV: 106 fL — ABNORMAL HIGH (ref 80–100)
MONO ABS: 0.1 x10 3/mm — AB (ref 0.2–1.0)
Monocyte %: 1.9 %
Neutrophil #: 2.8 x10 3/mm (ref 1.4–6.5)
Neutrophil %: 92.1 %
PLATELETS: 133 x10 3/mm — AB (ref 150–440)
RBC: 3.22 10*6/uL — ABNORMAL LOW (ref 4.40–5.90)
RDW: 14.8 % — AB (ref 11.5–14.5)
WBC: 3.1 x10 3/mm — ABNORMAL LOW (ref 3.8–10.6)

## 2014-05-27 ENCOUNTER — Inpatient Hospital Stay: Payer: Self-pay | Admitting: Internal Medicine

## 2014-05-27 LAB — BASIC METABOLIC PANEL
ANION GAP: 6 — AB (ref 7–16)
BUN: 21 mg/dL — AB (ref 7–18)
CREATININE: 0.99 mg/dL (ref 0.60–1.30)
Calcium, Total: 8.5 mg/dL (ref 8.5–10.1)
Chloride: 98 mmol/L (ref 98–107)
Co2: 27 mmol/L (ref 21–32)
EGFR (African American): >60
GLUCOSE: 83 mg/dL (ref 65–99)
OSMOLALITY: 265 (ref 275–301)
Potassium: 3.5 mmol/L (ref 3.5–5.1)
SODIUM: 131 mmol/L — AB (ref 136–145)

## 2014-05-27 LAB — CBC WITH DIFFERENTIAL/PLATELET
Basophil #: 0 10*3/uL (ref 0.0–0.1)
Basophil %: 0.2 %
EOS PCT: 2.8 %
Eosinophil #: 0 10*3/uL (ref 0.0–0.7)
HCT: 26.9 % — ABNORMAL LOW (ref 40.0–52.0)
HGB: 9.1 g/dL — AB (ref 13.0–18.0)
Lymphocyte #: 0.1 10*3/uL — ABNORMAL LOW (ref 1.0–3.6)
Lymphocyte %: 3.6 %
MCH: 35.7 pg — ABNORMAL HIGH (ref 26.0–34.0)
MCHC: 33.6 g/dL (ref 32.0–36.0)
MCV: 106 fL — ABNORMAL HIGH (ref 80–100)
MONO ABS: 0 x10 3/mm — AB (ref 0.2–1.0)
Monocyte %: 3.3 %
NEUTROS ABS: 1.3 10*3/uL — AB (ref 1.4–6.5)
NEUTROS PCT: 90.1 %
PLATELETS: 111 10*3/uL — AB (ref 150–440)
RBC: 2.54 10*6/uL — ABNORMAL LOW (ref 4.40–5.90)
RDW: 14.6 % — ABNORMAL HIGH (ref 11.5–14.5)
WBC: 1.4 10*3/uL — CL (ref 3.8–10.6)

## 2014-05-27 LAB — URINALYSIS, COMPLETE
BILIRUBIN, UR: NEGATIVE
Blood: NEGATIVE
Glucose,UR: NEGATIVE mg/dL (ref 0–75)
KETONE: NEGATIVE
LEUKOCYTE ESTERASE: NEGATIVE
Nitrite: NEGATIVE
PH: 6 (ref 4.5–8.0)
RBC,UR: 3 /HPF (ref 0–5)
Specific Gravity: 1.025 (ref 1.003–1.030)
Squamous Epithelial: 1
WBC UR: 2 /HPF (ref 0–5)

## 2014-05-28 LAB — BASIC METABOLIC PANEL
ANION GAP: 7 (ref 7–16)
BUN: 20 mg/dL — ABNORMAL HIGH (ref 7–18)
CREATININE: 1.2 mg/dL (ref 0.60–1.30)
Calcium, Total: 7.8 mg/dL — ABNORMAL LOW (ref 8.5–10.1)
Chloride: 100 mmol/L (ref 98–107)
Co2: 26 mmol/L (ref 21–32)
EGFR (African American): >60
EGFR (Non-African Amer.): >60
GLUCOSE: 84 mg/dL (ref 65–99)
Osmolality: 268 (ref 275–301)
Potassium: 3.6 mmol/L (ref 3.5–5.1)
Sodium: 133 mmol/L — ABNORMAL LOW (ref 136–145)

## 2014-05-28 LAB — CBC WITH DIFFERENTIAL/PLATELET
BASOS PCT: 0.9 %
Basophil #: 0 10*3/uL (ref 0.0–0.1)
EOS ABS: 0 10*3/uL (ref 0.0–0.7)
Eosinophil %: 3.9 %
HCT: 29.2 % — ABNORMAL LOW (ref 40.0–52.0)
HGB: 10.1 g/dL — ABNORMAL LOW (ref 13.0–18.0)
LYMPHS ABS: 0 10*3/uL — AB (ref 1.0–3.6)
Lymphocyte %: 4.6 %
MCH: 36.5 pg — AB (ref 26.0–34.0)
MCHC: 34.6 g/dL (ref 32.0–36.0)
MCV: 106 fL — ABNORMAL HIGH (ref 80–100)
MONOS PCT: 3.6 %
Monocyte #: 0 x10 3/mm — ABNORMAL LOW (ref 0.2–1.0)
NEUTROS PCT: 87 %
Neutrophil #: 0.9 10*3/uL — ABNORMAL LOW (ref 1.4–6.5)
PLATELETS: 96 10*3/uL — AB (ref 150–440)
RBC: 2.77 10*6/uL — ABNORMAL LOW (ref 4.40–5.90)
RDW: 14.5 % (ref 11.5–14.5)
WBC: 1 10*3/uL — CL (ref 3.8–10.6)

## 2014-05-29 LAB — CBC WITH DIFFERENTIAL/PLATELET
Basophil #: 0 10*3/uL (ref 0.0–0.1)
Basophil %: 0.2 %
EOS ABS: 0 10*3/uL (ref 0.0–0.7)
EOS PCT: 2.1 %
HCT: 28.7 % — AB (ref 40.0–52.0)
HGB: 9.9 g/dL — ABNORMAL LOW (ref 13.0–18.0)
LYMPHS ABS: 0.1 10*3/uL — AB (ref 1.0–3.6)
LYMPHS PCT: 2.4 %
MCH: 36.2 pg — AB (ref 26.0–34.0)
MCHC: 34.3 g/dL (ref 32.0–36.0)
MCV: 105 fL — ABNORMAL HIGH (ref 80–100)
MONO ABS: 0.1 x10 3/mm — AB (ref 0.2–1.0)
MONOS PCT: 2.7 %
NEUTROS PCT: 92.6 %
Neutrophil #: 2 10*3/uL (ref 1.4–6.5)
Platelet: 85 10*3/uL — ABNORMAL LOW (ref 150–440)
RBC: 2.73 10*6/uL — ABNORMAL LOW (ref 4.40–5.90)
RDW: 14 % (ref 11.5–14.5)
WBC: 2.1 10*3/uL — AB (ref 3.8–10.6)

## 2014-05-29 LAB — BASIC METABOLIC PANEL
ANION GAP: 8 (ref 7–16)
BUN: 17 mg/dL (ref 7–18)
CALCIUM: 7.5 mg/dL — AB (ref 8.5–10.1)
CO2: 26 mmol/L (ref 21–32)
Chloride: 102 mmol/L (ref 98–107)
Creatinine: 1.17 mg/dL (ref 0.60–1.30)
Glucose: 86 mg/dL (ref 65–99)
OSMOLALITY: 273 (ref 275–301)
POTASSIUM: 3.2 mmol/L — AB (ref 3.5–5.1)
SODIUM: 136 mmol/L (ref 136–145)

## 2014-05-30 LAB — BASIC METABOLIC PANEL
Anion Gap: 8 (ref 7–16)
BUN: 15 mg/dL (ref 7–18)
CALCIUM: 7.5 mg/dL — AB (ref 8.5–10.1)
CHLORIDE: 103 mmol/L (ref 98–107)
Co2: 25 mmol/L (ref 21–32)
Creatinine: 1.14 mg/dL (ref 0.60–1.30)
EGFR (Non-African Amer.): >60
Glucose: 85 mg/dL (ref 65–99)
Osmolality: 272 (ref 275–301)
Potassium: 3.5 mmol/L (ref 3.5–5.1)
SODIUM: 136 mmol/L (ref 136–145)

## 2014-05-30 LAB — CBC WITH DIFFERENTIAL/PLATELET
BASOS ABS: 0 10*3/uL (ref 0.0–0.1)
Basophil %: 0.5 %
Eosinophil #: 0 10*3/uL (ref 0.0–0.7)
Eosinophil %: 3.4 %
HCT: 27.3 % — ABNORMAL LOW (ref 40.0–52.0)
HGB: 9.5 g/dL — ABNORMAL LOW (ref 13.0–18.0)
Lymphocyte #: 0 10*3/uL — ABNORMAL LOW (ref 1.0–3.6)
Lymphocyte %: 3.7 %
MCH: 36.3 pg — AB (ref 26.0–34.0)
MCHC: 34.7 g/dL (ref 32.0–36.0)
MCV: 105 fL — ABNORMAL HIGH (ref 80–100)
MONO ABS: 0.1 x10 3/mm — AB (ref 0.2–1.0)
Monocyte %: 4.9 %
Neutrophil #: 1 10*3/uL — ABNORMAL LOW (ref 1.4–6.5)
Neutrophil %: 87.5 %
Platelet: 78 10*3/uL — ABNORMAL LOW (ref 150–440)
RBC: 2.61 10*6/uL — ABNORMAL LOW (ref 4.40–5.90)
RDW: 14.7 % — ABNORMAL HIGH (ref 11.5–14.5)
WBC: 1.2 10*3/uL — CL (ref 3.8–10.6)

## 2014-05-31 ENCOUNTER — Ambulatory Visit: Payer: Self-pay | Admitting: Oncology

## 2014-05-31 LAB — CBC WITH DIFFERENTIAL/PLATELET
BASOS ABS: 0 10*3/uL (ref 0.0–0.1)
Basophil %: 0.6 %
EOS ABS: 0 10*3/uL (ref 0.0–0.7)
EOS PCT: 4 %
HCT: 26.9 % — ABNORMAL LOW (ref 40.0–52.0)
HGB: 9 g/dL — ABNORMAL LOW (ref 13.0–18.0)
Lymphocyte #: 0 10*3/uL — ABNORMAL LOW (ref 1.0–3.6)
Lymphocyte %: 7.5 %
MCH: 35.7 pg — AB (ref 26.0–34.0)
MCHC: 33.4 g/dL (ref 32.0–36.0)
MCV: 107 fL — ABNORMAL HIGH (ref 80–100)
Monocyte #: 0.1 x10 3/mm — ABNORMAL LOW (ref 0.2–1.0)
Monocyte %: 9.5 %
Neutrophil #: 0.5 10*3/uL — ABNORMAL LOW (ref 1.4–6.5)
Neutrophil %: 78.4 %
Platelet: 73 10*3/uL — ABNORMAL LOW (ref 150–440)
RBC: 2.52 10*6/uL — ABNORMAL LOW (ref 4.40–5.90)
RDW: 14.5 % (ref 11.5–14.5)
WBC: 0.6 10*3/uL — AB (ref 3.8–10.6)

## 2014-06-03 DIAGNOSIS — F419 Anxiety disorder, unspecified: Secondary | ICD-10-CM

## 2014-06-03 DIAGNOSIS — K219 Gastro-esophageal reflux disease without esophagitis: Secondary | ICD-10-CM

## 2014-06-03 DIAGNOSIS — J189 Pneumonia, unspecified organism: Secondary | ICD-10-CM

## 2014-06-03 DIAGNOSIS — I5042 Chronic combined systolic (congestive) and diastolic (congestive) heart failure: Secondary | ICD-10-CM

## 2014-06-03 DIAGNOSIS — I480 Paroxysmal atrial fibrillation: Secondary | ICD-10-CM

## 2014-07-01 ENCOUNTER — Ambulatory Visit: Payer: Self-pay | Admitting: Oncology

## 2014-10-22 NOTE — H&P (Signed)
PATIENT NAME:  Erik Bonilla, Erik Bonilla MR#:  [id number] DATE OF BIRTH:  06-23-1934  DATE OF ADMISSION:  05/27/2014  PRIMARY CARE PHYSICIAN: Glendon Axe, MD  REFERRING EMERGENCY ROOM PHYSICIAN: Debbrah Alar, MD  CHIEF COMPLAINT: Weakness and fatigue.   HISTORY OF PRESENT ILLNESS: The patient is a 81 year old male with a past medical history of multiple myeloma. Sees Dr. Grayland Ormond as an outpatient. Last chemotherapy was last week. He presented to the ED with a chief complaint of generalized weakness and fatigue. The patient has a chronic history of obstructive sleep apnea and uses CPAP at bedtime. In the ED, the patient's white count is low at 1.4. Chest x-ray shows questionable infiltrate. The patient is complaining of a cough for the past few days. Urinalysis is negative. Hospitalist team is called to admit the patient for generalized weakness and possible pneumonia. During my examination, the patient is resting comfortably, denies any chest pain or shortness of breath. Daughter is at bedside. No other complaints. Denies any fever. No sick contacts. Chronically takes dexamethasone and also Eliquis for chronic atrial fibrillation. In the ED, the patient is started on IV antibiotics.   PAST MEDICAL HISTORY: Chronic congestive heart failure, chronic atrial fibrillation on Eliquis, multiple myeloma, history of prostate cancer.   PAST SURGICAL HISTORY: Status post prostatectomy and radiation treatment, bilateral hernia repair, hydrocele repair.   ALLERGIES: No known drug allergies.   PSYCHOSOCIAL HISTORY: Lives at home with wife. He used to smoke, but quit smoking. Also, he used a pipe for 32 years.   FAMILY HISTORY: Father has a history of lung cancer. Mother has a history of dementia.   HOME MEDICATIONS: Percocet 1 tablet p.o. q. 8 hours as needed for pain, alprazolam 0.25 mg 1 tablet p.o. once a day as needed for anxiety, Benicar 40 mg p.o. once daily, citalopram 10 mg p.o. at bedtime, Claritin 10 mg  once daily, clonazepam 0.5 mg 1 tablet p.o. once a day as needed for anxiety and nervousness, cyanocobalamin 2500 mcg sublingually once a day, dexamethasone 4 mg 5 tablets orally once a week on Tuesday, Colace 100 mg 1 capsule p.o. 2 times a day, Eliquis 5 mg 2 times a day, iron sulfate 325 mg 1 tablet p.o. once daily, hydrochlorothiazide 25 mg 1 tablet p.o. once daily as needed for fluid accumulation, metoprolol 50 mg 1-1/2 tablets once daily, oxycodone extended release 10 mg 1 tablet p.o. every 12 hours as needed for pain, Pomalyst 4 mg 1 capsule p.o. once daily, potassium chloride 10 mEq p.o. 2 times a day, zolpidem 5 mg 1 tablet p.o. once daily at bedtime, Revlimid 10 mg 1 capsule p.o. once a day for 21 days and then 7 days off, mirabegron 25 mg 1 capsule p.o. once daily at bedtime.   REVIEW OF SYSTEMS: CONSTITUTIONAL: Denies any weight gain or weight loss, but complaining of fatigue and weakness. No fever.  HEENT: Denies any headache or blurry vision. CARDIOVASCULAR: Has chronic history of atrial fibrillation. Denies any palpitations. Denies any chest pain or shortness of breath. RESPIRATORY: Complaining of cough and denies any postnasal drip. GASTROINTESTINAL: Denies any nausea, vomiting, abdominal pain, hematemesis, or melena. HEMATOLOGIC AND ONCOLOGIC: Complaining of multiple myeloma. Gets chemotherapy. Last chemotherapy was last week. Also has a history of prostate cancer status post prostatectomy and getting radiation therapy.  ENDOCRINE: Denies any diabetes, hypothyroidism, cold or heat intolerance.  NEUROLOGIC: Denies any vertigo, ataxia, or strokes. PSYCHIATRIC: Denies any history of anxiety or depression.  PHYSICAL EXAMINATION:  VITAL SIGNS: Temperature  98.6, pulse 92 , blood pressure 98/66, pulse oximetry is 90% on room air.  GENERAL APPEARANCE: Not in acute distress. Moderately built and nourished.  HEENT: Normocephalic, atraumatic. Pupils are equally reacting to light and  accommodation. No scleral icterus. No conjunctival injection. No sinus tenderness. No postnasal drip. Moist mucous membranes.  NECK: Supple. No JVD. No thyromegaly. Range of motion is intact.  LUNGS: With crackles and rhonchi. No wheezing CARDIOVASCULAR: Irregularly irregular. No murmurs.  GASTROINTESTINAL: Soft. Bowel sounds are positive in all 4 quadrants. Nontender, nondistended. No hepatosplenomegaly. No masses.  NEUROLOGIC: Awake, alert, oriented x 3. Cranial nerves II through XII are grossly intact. Motor and sensory are intact. Reflexes are 2+.  EXTREMITIES: No edema, no cyanosis, no clubbing.  SKIN: Warm to touch. Normal turgor. No rashes. No lesions.  MUSCULOSKELETAL: No joint effusion, tenderness, or edema. PSYCHIATRIC: Normal mood and affect.  LABORATORY AND IMAGING STUDIES:   URINALYSIS: Yellow in color, hazy in appearance. Ketones negative. Bilirubin negative. Blood negative. Protein 30 mg/dL. Nitrites and leukocyte esterase are negative. WBC 2 per high-power field. WBC 1.4, hemoglobin 9.1, hematocrit is 26.9, platelets are 111,000. Neutrophil count is 1.3. Glucose 83, BUN 21, creatinine 0.9, sodium 131, potassium 3.5, chloride 98, CO2 of 27. GFR greater than 60. Anion gap is 6. Serum osmolality and calcium are normal. Chest x-ray, PA and lateral views: Mild left basal atelectasis, mild infiltrate cannot be excluded. Cardiomegaly and pulmonary venous congestion. Port-A-Cath in good anatomical position. Twelve-lead EKG: Atrial fibrillation with no acute ST-T wave changes.   ASSESSMENT AND PLAN: A 81 year old male presenting to the Emergency Department with a chief complaint of weakness and fatigue; history of multiple myeloma on chemotherapy, sees Dr. Grayland Ormond; status post prostatectomy for prostate cancer, had radiation therapy in the past. The patient's last chemotherapy was last week. The patient is neutropenic and has possible infiltrate on the left side of the chest on chest x-ray.   1.  Generalized weakness with neutropenia, probably from underlying left-sided pneumonia. The patient is immunocompromised and takes dexamethasone regularly. He is neutropenic. We will put him on intravenous Zosyn and levofloxacin. Will get a sputum culture and sensitivity. Will put a consult to Dr. Grayland Ormond.  2.  Hyponatremia. We will provide gentle hydration with intravenous fluids.  3.  Multiple myeloma. Chemotherapy. Last chemotherapy was last week. Will put a consult to Dr. Grayland Ormond.  4.  Chronic atrial fibrillation, rate controlled on Eliquis. We will continue that.  5.  Chronic congestive heart failure, not fluid overloaded. Currently, the patient is hypotensive. We will hold off on his hypertensives and diuretic medications.  6.  Will provide gastrointestinal prophylaxis. Deep vein thrombosis prophylaxis is not needed as the patient is on Eliquis.  CODE STATUS: He is full code. Wife is the medical power-of-attorney.  Plan of care discussed in detail with the patient's daughter at bedside. She verbalized understanding of the plan.  TOTAL TIME SPENT: 50 minutes.  The patient will be transferred to Dr. Glendon Axe.    ____________________________ Nicholes Mango, MD ag:ST D: 05/27/2014 22:22:21 ET T: 05/27/2014 23:30:32 ET JOB#: [id number]  cc: Nicholes Mango, MD, <Dictator> Nicholes Mango MD ELECTRONICALLY SIGNED 06/16/2014 16:48

## 2014-10-22 NOTE — Consult Note (Signed)
Note Type Consult   (Removed):    Subjective: Chief Complaint/Diagnosis:   Multiple myeloma, IgG subtype. Cytogenetics revealed a loss of Y in 6/20 cells.  FISH revealed a gain in 1q and deletion of 1p and 16q. HPI:   Patient admitted to the hospital with neutropenia, worsening weakness and fatigue, and likely community-acquired pneumonia. He has some mild dehydration and continued weight loss as well. He does not complain of congestion or edema today. He has some mild confusion, but has no neurologic complaints. He denies any recent fevers. He has no chest pain. He denies any nausea, vomiting, constipation, or diarrhea. Patient feels generally terrible, but offers no further specific complaints.   Review of Systems:  Performance Status (ECOG): 3  Pain ?: No complaints (0, none)  Emotional well-being: None  Review of Systems:   As per HPI. Otherwise, 10 point system review was negative.   Allergies:  No Known Allergies:   PFSH: Additional Past Medical and Surgical History: Prostate cancer status post prostatectomy and XRT in 2010, CHF, atrial fibrillation.  Bilateral hernia repair, hydrocele repair.    Family history: Father with lung cancer, mother with dementia.    Social history: Previous tobacco, smoked cigarettes for 7 years and a pipe for 32 years.  Occasional alcohol.   Home Medications: Medication Instructions Last Modified Date/Time  Revlimid 10 mg oral capsule 1 cap(s) orally once a day for 21 days, then 7 days off 27-Nov-15 21:54  hydrochlorothiazide 25 mg oral tablet 1 tab(s) orally once a day, As Needed for fluid 27-Nov-15 21:54  ALPRAZolam 0.25 mg oral tablet 1 tab(s) orally once a day, As Needed - for Anxiety, Nervousness 27-Nov-15 21:54  Benicar 40 mg oral tablet 1 tab(s) orally once a day 27-Nov-15 21:54  citalopram 10 mg oral tablet 1 tab(s) orally once a day (at bedtime) 27-Nov-15 21:54  clonazePAM 0.5 mg oral tablet 1 tab(s) orally once a day (at bedtime),  As Needed - for Anxiety, Nervousness 27-Nov-15 21:54  cyanocobalamin 2500 mcg sublingual tablet 1 tab(s) sublingual once a day 27-Nov-15 21:54  dexamethasone 4 mg oral tablet 5 tab(s) orally once a week on Tuesday 27-Nov-15 21:54  docusate sodium 100 mg oral capsule 1 cap(s) orally 2 times a day 27-Nov-15 21:54  Eliquis 5 mg oral tablet 1 tab(s) orally 2 times a day 27-Nov-15 21:54  acetaminophen-HYDROcodone 325 mg-5 mg oral tablet 1 tab(s) orally every 8 hours, As Needed - for Pain 27-Nov-15 21:54  ferrous sulfate 325 mg oral tablet 1 tab(s) orally once a day 27-Nov-15 21:54  mirabegron 25 mg oral tablet, extended release 1 tab(s) orally once a day (at bedtime) 27-Nov-15 21:54  oxyCODONE extended release 10 mg oral tablet, extended release 1 tab(s) orally every 12 hours, As Needed - for Pain 27-Nov-15 21:54  Pomalyst 4 mg oral capsule 1 cap(s) orally once a day 27-Nov-15 21:54  potassium chloride 10 mEq oral tablet, extended release 1 tab(s) orally 2 times a day 27-Nov-15 21:54  metoprolol succinate 50 mg oral tablet, extended release 1.5 tab(s) orally once a day 27-Nov-15 21:54  zolpidem 5 mg oral tablet 1 tab(s) orally once a day (at bedtime) 27-Nov-15 21:54  Claritin 10 mg oral tablet 1 tab(s) orally once a day 27-Nov-15 21:54   Vital Signs:  :: vital signs stable, hypotension, afebrile.   Physical Exam:  General: ill-appearing, no acute distress.  Mental Status: normal affect  Eyes: anicteric sclera  Respiratory: clear to auscultation bilaterally  Cardiovascular: regular rate and rhythm,  no murmur, rub, or gallop  Gastrointestinal: soft, nondistended, nontender, no organomegaly.  normal active bowel sounds  Musculoskeletal: No edema  Skin: No rash or petechiae noted  Neurological: alert, answering all questions appropriately.  Cranial nerves grossly intact   Laboratory Results:  Routine Chem:  28-Nov-15 05:06   Result Comment LABS - This specimen was collected through an   -  indwelling catheter or arterial line.  - A minimum of 50ms of blood was wasted prior    - to collecting the sample.  Interpret  - results with caution. WBC - RESULTS VERIFIED BY REPEAT TESTING. - CRITICAL VALUE PREVIOUSLY NOTIFIED.  Result(s) reported on 28 May 2014 at 05:28AM.  Glucose, Serum 84  BUN  20  Creatinine (comp) 1.20  Sodium, Serum  133  Potassium, Serum 3.6  Chloride, Serum 100  CO2, Serum 26  Calcium (Total), Serum  7.8  Anion Gap 7  Osmolality (calc) 268  eGFR (African American) >60  eGFR (Non-African American) >60 (eGFR values <660mmin/1.73 m2 may be an indication of chronic kidney disease (CKD). Calculated eGFR, using the MRDR Study equation, is useful in  patients with stable renal function. The eGFR calculation will not be reliable in acutely ill patients when serum creatinine is changing rapidly. It is not useful in patients on dialysis. The eGFR calculation may not be applicable to patients at the low and high extremes of body sizes, pregnant women, and vegetarians.)  Routine Hem:  28-Nov-15 05:06   WBC (CBC)  1.0  RBC (CBC)  2.77  Hemoglobin (CBC)  10.1  Hematocrit (CBC)  29.2  Platelet Count (CBC)  96  MCV  106  MCH  36.5  MCHC 34.6  RDW 14.5  Neutrophil % 87.0  Lymphocyte % 4.6  Monocyte % 3.6  Eosinophil % 3.9  Basophil % 0.9  Neutrophil #  0.9  Lymphocyte #  0.0  Monocyte #  0.0  Eosinophil # 0.0  Basophil # 0.0   Review Pathology Report:  Medical Imaging Results:   Review Medical Imaging   PA and Lateral 27-May-2014 16:33:00: IMPRESSION:  1.  Mild left base atelectasis, mild infiltrate cannot be excluded.    2.  Cardiomegaly, no pulmonary venous congestion.    3.  Port-A-Cath in good anatomic position.      Electronically Signed    By: ThMarcello MooresRegister    On: 05/27/2014 16:38     Verified By: THOsa CraverM.D., MD  Assessment and Plan: Impression:   Progressive multiple myeloma, IgG subtype, now with neutropenia secondary to  chemotherapy, worsening performance status, and possible pneumonia.  Plan:   1. Multiple myeloma: Patient recently received cycle 1, day 8 of Elotuzumab 1070mg weekly last Tuesday along with oral 10 mg Revlimid daily.  He has been instructed to stop his Revlimid and will delay his next infusion of Elotuzumab. Patient will likely need rehabilitation to improve his performance status prior to reinitiating treatments. Will schedule follow-up for further treatment planning upon discharge. Patient may also benefit from palliative care consult on Monday if he remains in the hospital.  Hospice has also been discussed with his daughters, but no definitive decision has been made.Pancytopenia: Secondary to chemotherapy. Continue to monitor daily CBC. Neupogen 300 g subcutaneous daily until [facility] greater than 1000. Patient does not require transfusion at this time.Weakness and fatigue: Multifactorial. Patient will likely need rehabilitation as above.Weight loss: Secondary to diuretic as well as poor appetite. Consider dietary consult.Pneumonia: Agree with current antibiotics. consult,  will follow.  Fax to Physician (Removed):  Advance Directive:  Advance Directive ([place]) yes   Do you want to revise or change your advance directive? No   Electronic Signatures: Delight Hoh (MD)  (Signed [phone] 14:37)  Authored: Note Type, History of Present Illness, CC/HPI, Review of Systems, ALLERGIES, Patient Family Social History, HOME MEDICATIONS, Vital Signs, Physical Exam, Lab Results Review, Pathology Report Review, Rad Results Review, Assessment and Plan, Fax to Physician, Advance Directive   Last Updated: 28-Nov-15 14:37 by Delight Hoh (MD)

## 2014-10-22 NOTE — Op Note (Signed)
PATIENT NAME:  Erik Bonilla, Erik Bonilla MR#:  [id number] DATE OF BIRTH:  [birth year]  DATE OF PROCEDURE:  03/02/2014  PREOPERATIVE DIAGNOSES: 1. Multiple myeloma with poor venous access.  2. Atrial fibrillation.  3. Prostate cancer.  POSTOPERATIVE DIAGNOSES:  1. Multiple myeloma with poor venous access.  2. Atrial fibrillation.  3. Prostate cancer.  PROCEDURES:  1.  Ultrasound guidance for vascular access, right internal jugular vein.  2.  Fluoroscopic guidance for placement of catheter.  3.  Placement of CT compatible Port-A-Cath, right internal jugular vein.   SURGEON:  Algernon Huxley, MD   ANESTHESIA:  Local with moderate conscious sedation.   FLUOROSCOPY TIME:  Less than 1 minute.   CONTRAST:  Zero.   ESTIMATED BLOOD LOSS:  25 mL.   INDICATION FOR PROCEDURE:  A 81 year old gentleman with multiple myeloma. He needs a Port-A-Cath for chemotherapy access and durable venous access. Risks and benefits were discussed. Informed consent was obtained.   DESCRIPTION OF THE PROCEDURE:  The patient was brought to the vascular and interventional radiology suite. The right neck and chest were sterilely prepped and draped, and a sterile surgical field was created. Ultrasound was used to help visualize a patent right internal jugular vein. This was then accessed under direct ultrasound guidance without difficulty with a Seldinger needle and a permanent image was recorded. A J-wire was placed. After skin nick and dilatation, the peel-away sheath was then placed over the wire. I then anesthetized an area under the clavicle approximately 2 fingerbreadths. A transverse incision was created and an inferior pocket was created with electrocautery and blunt dissection. The port was then brought onto the field, placed into the pocket and secured to the chest wall with 2 Prolene sutures. The catheter was connected to the port and tunneled from the subclavicular incision to the access site. Fluoroscopic guidance was used  to cut the catheter to an appropriate length. The catheter was then placed through the peel-away sheath and the peel-away sheath was removed. The catheter tip was parked in excellent location in the mid-superior vena cava. The pocket was then irrigated with antibiotic-impregnated saline and the wound was closed with a running 3-0 Vicryl and a 4-0 Monocryl. The access incision was closed with a single 4-0 Monocryl. The Huber needle was used to withdraw blood and flush the port with heparinized saline. Dermabond was then placed as a dressing. The patient tolerated the procedure well and was taken to the recovery room in stable condition.      ____________________________ Algernon Huxley, MD jsd:lm D: 03/02/2014 09:42:27 ET T: 03/02/2014 16:34:38 ET JOB#: [id number]  cc: Algernon Huxley, MD, <Dictator> Algernon Huxley MD ELECTRONICALLY SIGNED 03/14/2014 15:05

## 2014-10-22 NOTE — Consult Note (Signed)
Note Type Consult   Subjective: Chief Complaint/Diagnosis:   Multiple myeloma, IgG subtype. Cytogenetics revealed a loss of Y in 6/20 cells.  FISH revealed a gain in 1q and deletion of 1p and 16q. HPI:   Patient states he feels stronger today, but still was not back to baseline. He does not complain of any pain. He has had no fevers. He otherwise feels well.    Review of Systems:  Performance Status (ECOG): 3  Pain ?: No complaints (0, none)  Emotional well-being: None  Review of Systems:   As per HPI. Otherwise, 10 point system review was negative.   Allergies:  No Known Allergies:   PFSH: Additional Past Medical and Surgical History: Prostate cancer status post prostatectomy and XRT in 2010, CHF, atrial fibrillation.  Bilateral hernia repair, hydrocele repair.    Family history: Father with lung cancer, mother with dementia.    Social history: Previous tobacco, smoked cigarettes for 7 years and a pipe for 32 years.  Occasional alcohol.   Home Medications: Medication Instructions Last Modified Date/Time  Revlimid 10 mg oral capsule 1 cap(s) orally once a day for 21 days, then 7 days off 27-Nov-15 21:54  hydrochlorothiazide 25 mg oral tablet 1 tab(s) orally once a day, As Needed for fluid 27-Nov-15 21:54  ALPRAZolam 0.25 mg oral tablet 1 tab(s) orally once a day, As Needed - for Anxiety, Nervousness 27-Nov-15 21:54  Benicar 40 mg oral tablet 1 tab(s) orally once a day 27-Nov-15 21:54  citalopram 10 mg oral tablet 1 tab(s) orally once a day (at bedtime) 27-Nov-15 21:54  clonazePAM 0.5 mg oral tablet 1 tab(s) orally once a day (at bedtime), As Needed - for Anxiety, Nervousness 27-Nov-15 21:54  cyanocobalamin 2500 mcg sublingual tablet 1 tab(s) sublingual once a day 27-Nov-15 21:54  dexamethasone 4 mg oral tablet 5 tab(s) orally once a week on Tuesday 27-Nov-15 21:54  docusate sodium 100 mg oral capsule 1 cap(s) orally 2 times a day 27-Nov-15 21:54  Eliquis 5 mg oral tablet 1  tab(s) orally 2 times a day 27-Nov-15 21:54  acetaminophen-HYDROcodone 325 mg-5 mg oral tablet 1 tab(s) orally every 8 hours, As Needed - for Pain 27-Nov-15 21:54  ferrous sulfate 325 mg oral tablet 1 tab(s) orally once a day 27-Nov-15 21:54  mirabegron 25 mg oral tablet, extended release 1 tab(s) orally once a day (at bedtime) 27-Nov-15 21:54  oxyCODONE extended release 10 mg oral tablet, extended release 1 tab(s) orally every 12 hours, As Needed - for Pain 27-Nov-15 21:54  Pomalyst 4 mg oral capsule 1 cap(s) orally once a day 27-Nov-15 21:54  potassium chloride 10 mEq oral tablet, extended release 1 tab(s) orally 2 times a day 27-Nov-15 21:54  metoprolol succinate 50 mg oral tablet, extended release 1.5 tab(s) orally once a day 27-Nov-15 21:54  zolpidem 5 mg oral tablet 1 tab(s) orally once a day (at bedtime) 27-Nov-15 21:54  Claritin 10 mg oral tablet 1 tab(s) orally once a day 27-Nov-15 21:54   Vital Signs:  :: vital signs stable, patient afebrile.   Physical Exam:  General: ill-appearing, no acute distress.  Mental Status: normal affect  Eyes: anicteric sclera  Respiratory: clear to auscultation bilaterally  Cardiovascular: regular rate and rhythm, no murmur, rub, or gallop  Gastrointestinal: soft, nondistended, nontender, no organomegaly.  normal active bowel sounds  Musculoskeletal: No edema  Skin: No rash or petechiae noted  Neurological: alert, answering all questions appropriately.  Cranial nerves grossly intact   Laboratory Results: Routine Chem:  30-Nov-15 04:42   Glucose, Serum 85  BUN 15  Creatinine (comp) 1.14  Sodium, Serum 136  Potassium, Serum 3.5  Chloride, Serum 103  CO2, Serum 25  Calcium (Total), Serum  7.5  Anion Gap 8  Osmolality (calc) 272  eGFR (African American) >60  eGFR (Non-African American) >60 (eGFR values <34m/min/1.73 m2 may be an indication of chronic kidney disease (CKD). Calculated eGFR, using the MRDR Study equation, is useful in   patients with stable renal function. The eGFR calculation will not be reliable in acutely ill patients when serum creatinine is changing rapidly. It is not useful in patients on dialysis. The eGFR calculation may not be applicable to patients at the low and high extremes of body sizes, pregnant women, and vegetarians.)  Result Comment LABS - This specimen was collected through an   - indwelling catheter or arterial line.  - A minimum of 543m of blood was wasted prior    - to collecting the sample.  Interpret  - results with caution. WBC - RESULTS VERIFIED BY REPEAT TESTING. - CRITICAL VALUE PREVIOUSLY NOTIFIED.  Result(s) reported on 30 May 2014 at 05:06AM.  Routine Hem:  30-Nov-15 04:42   WBC (CBC)  1.2  RBC (CBC)  2.61  Hemoglobin (CBC)  9.5  Hematocrit (CBC)  27.3  Platelet Count (CBC)  78  MCV  105  MCH  36.3  MCHC 34.7  RDW  14.7  Neutrophil % 87.5  Lymphocyte % 3.7  Monocyte % 4.9  Eosinophil % 3.4  Basophil % 0.5  Neutrophil #  1.0  Lymphocyte #  0.0  Monocyte #  0.1  Eosinophil # 0.0  Basophil # 0.0   Assessment and Plan: Impression:   Progressive multiple myeloma, IgG subtype, now with neutropenia secondary to chemotherapy, worsening performance status, and possible pneumonia.  Plan:   1. Multiple myeloma: Patient recently received cycle 1, day 8 of Elotuzumab 1031mg weekly last Tuesday along with oral 10 mg Revlimid daily.  He has been instructed to stop his Revlimid and will delay his next infusion of Elotuzumab. Patient will need rehabilitation to improve his performance status if he wishes to reinitiating treatments. Follow-Up in the [place] 2-3 weeks after discharge for further evaluation.  Hospice has also been discussed with his daughters, but no definitive decision has been made.Pancytopenia: Secondary to chemotherapy. Continue to monitor daily CBC. Neupogen 300 g subcutaneous today. Patient will receive Neulasta prior to discharge  tomorrow.Weakness and fatigue: Multifactorial. Patient will likely need rehabilitation as above.Weight loss: Secondary to diuretic as well as poor appetite. Consider dietary consult.Pneumonia: Agree with current antibiotics. consult, call with questions.  Advance Directive:  Advance Directive ([place]   Do you want to revise or change your advance directive? No   Electronic Signatures: FinDelight HohD)  (Signed [phone])  Authored: Note Type, CC/HPI, Review of Systems, ALLERGIES, Patient Family Social History, HOME MEDICATIONS, Vital Signs, Physical Exam, Lab Results Review, Assessment and Plan, Advance Directive   Last Updated: 30-Nov-15 17:22 by FinDelight HohD)

## 2014-10-22 NOTE — Discharge Summary (Signed)
Dates of Admission and Diagnosis:  Date of Admission 27-May-2014   Date of Discharge 31-May-2014   Admitting Diagnosis Generalized weakness, Neutropenia, left basilar Pneumonia, Hyponatremia   Final Diagnosis Generalized weakness, Neutropenia, left basilar Pneumonia, Hyponatremia   Discharge Diagnosis 1 Generalized weakness, Neutropenia, left basilar Pneumonia.   2 Multiple Myeloma   3 Chronic atrial fibrillation   4 OSA on CPAP    Chief Complaint/History of Present Illness 81 y/o male with hx of Multiple Myeloma, OSA and Chronic atrial fibrillation presented with cough and generalized weakness. Patient was noted to be neutropenic. CXR revealed infiltrate at left base, suggestive of left basilar Pneumonia. Refer to [place] for details.   Allergies:  No Known Allergies:     Cardiology:  28-Nov-15 14:09   Echo Doppler REASON FOR EXAM:     COMMENTS:     PROCEDURE: [facility] - ECHO DOPPLER COMPLETE(TRANSTHOR)  - May 28 2014  2:09PM   RESULT: Echocardiogram Report  Patient Name:   Erik Bonilla Date of Exam: 05/28/2014 Medical Rec #:  [id number]        Custom1: Date of Birth:  Feb 01, 1934    Height:       70.0 in Patient Age:    28 years      Weight:       176.0 lb Patient Gender: M             BSA:          1.98 m??  Indications: Atrial Fib Sonographer:    Arville Go RDCS Referring Phys: [facility], Nathanyl Andujo  Sonographer Comments: Technically difficult study due to poor echo  windows and suboptimal subcostal window.  Summary:  1. Left ventricular ejection fraction, by visual estimation, is 40 to  45%.  2. Mildly decreased global left ventricular systolic function.  3. Mild to moderate mitral valve regurgitation.  4. Mild aortic regurgitation.  5. Mild to moderate tricuspid regurgitation. LV SYSTOLIC FUNCTION BY 2D PLANIMETRY (MOD): EF-A4C View: 45.3 % SPECTRAL DOPPLER ANALYSIS (where applicable): Aortic Valve: AoV Max Vel: 1.45 m/s AoV Peak PG: 8.4 mmHg AoV Mean PG: LVOT Vmax:  1.09 m/s LVOT VTI:  LVOT Diameter: Aortic Insufficiency: AI Half-time:  565 msec AI Decel Rate: 1.86 m/s?? Tricuspid Valve and PA/RV Systolic Pressure: TR Max Velocity: 2.20 m/s RA  Pressure: 5 mmHg RVSP/PASP: 24.3 mmHg  PHYSICIAN INTERPRETATION: Left Ventricle: LV posterior wall thickness was normal. No left  ventricular hypertrophy. Global LV systolic function was mildly  decreased. Left ventricular ejection fraction, by visual estimation, is  40 to 45%. Right Ventricle: The right ventricular size is normal. Left Atrium: The left atrium is normal in size. Right Atrium: The right atrium is normal in size. Pericardium: There is no evidence of pericardial effusion. Mitral Valve: Mild to moderate mitral valve regurgitation is seen. Tricuspid Valve: Mild to moderate tricuspid regurgitation is visualized.  The tricuspid regurgitant velocity is 2.20 m/s, and with an assumed right  atrial pressure of 5 mmHg, the estimated right ventricular systolic  pressure is normal at 24.3 mmHg. Aortic Valve: Mild aortic valve regurgitation is seen. Aorta: The aortic root is normal in size and structure.  Thompson MD Electronically signed by [id number] Isaias Cowman MD Signature Date/Time: 05/29/2014/10:00:04 AM  *** Final *** IMPRESSION: .    Verified BySheppard Coil . PARASCHOS, M.D., MD  Routine Chem:  27-Nov-15 16:19   Result Comment wbc - RESULTS VERIFIED BY REPEAT TESTING.  - NOTIFIED OF CRITICAL VALUE  - smg called  gene [place] [phone]  - READ-BACK PROCESS PERFORMED.  Result(s) reported on 27 May 2014 at 05:18PM.  Glucose, Serum 83  BUN  21  Creatinine (comp) 0.99  Sodium, Serum  131  Potassium, Serum 3.5  Chloride, Serum 98  CO2, Serum 27  Calcium (Total), Serum 8.5  Anion Gap  6  Osmolality (calc) 265  eGFR (African American) >60  eGFR (Non-African American) >60 (eGFR values <33m/min/1.73 m2 may be an indication of chronic kidney disease (CKD). Calculated  eGFR, using the MRDR Study equation, is useful in  patients with stable renal function. The eGFR calculation will not be reliable in acutely ill patients when serum creatinine is changing rapidly. It is not useful in patients on dialysis. The eGFR calculation may not be applicable to patients at the low and high extremes of body sizes, pregnant women, and vegetarians.)  Routine UA:  27-Nov-15 16:19   Color (UA) Yellow  Clarity (UA) Hazy  Glucose (UA) Negative  Bilirubin (UA) Negative  Ketones (UA) Negative  Specific Gravity (UA) 1.025  Blood (UA) Negative  pH (UA) 6.0  Protein (UA) 30 mg/dL  Nitrite (UA) Negative  Leukocyte Esterase (UA) Negative (Result(s) reported on 27 May 2014 at 05:11PM.)  RBC (UA) 3 /HPF  WBC (UA) 2 /HPF  Bacteria (UA) TRACE  Epithelial Cells (UA) 1 /HPF  Mucous (UA) PRESENT (Result(s) reported on 27 May 2014 at 05:11PM.)  Routine Hem:  27-Nov-15 16:19   WBC (CBC)  1.4  RBC (CBC)  2.54  Hemoglobin (CBC)  9.1  Hematocrit (CBC)  26.9  Platelet Count (CBC)  111  MCV  106  MCH  35.7  MCHC 33.6  RDW  14.6  Neutrophil % 90.1  Lymphocyte % 3.6  Monocyte % 3.3  Eosinophil % 2.8  Basophil % 0.2  Neutrophil #  1.3  Lymphocyte #  0.1  Monocyte #  0.0  Eosinophil # 0.0  Basophil # 0.0  28-Nov-15 05:06   WBC (CBC)  1.0  Neutrophil % 87.0  29-Nov-15 05:08   WBC (CBC)  2.1  Neutrophil % 92.6  30-Nov-15 04:42   WBC (CBC)  1.2  Neutrophil % 87.5  01-Dec-15 11:17   WBC (CBC)  0.6  Neutrophil % 78.4   PERTINENT RADIOLOGY STUDIES: XRay:    27-Nov-15 16:33, Chest PA and Lateral  Chest PA and Lateral   REASON FOR EXAM:    SOB, weakness, hx of fluid retention, MM  COMMENTS:       PROCEDURE: DXR - DXR CHEST PA (OR AP) AND LATERAL  - May 27 2014  4:33PM     CLINICAL DATA:  Increased weakness.    EXAM:  CHEST  2 VIEW    COMPARISON:  None.    FINDINGS:  Port-A-Cath in good anatomic position. Cardiomegaly. Normal  pulmonary vascularity. Mild  mild left base subsegmental axis, mild  infiltrate cannot be excluded. Cardiomegaly with normal pulmonary  vascularity . Calcified left hilar lymph node. Old left rib  fracture.     IMPRESSION:  1.  Mild left base atelectasis, mild infiltrate cannot be excluded.    2.  Cardiomegaly, no pulmonary venous congestion.    3.  Port-A-Cath in good anatomic position.      Electronically Signed    By: TMarcello Moores Register    On: 05/27/2014 16:38     Verified By: TOsa Craver M.D., MD  [place]    05-Oct-15 14:38, Bone Survey Limited Dx  Bone Survey Limited Dx  REASON FOR EXAM:    multiple myeloma  COMMENTS:       PROCEDURE: DXR - DXR BONE SURVEY METASTATIC  - Apr 04 2014  2:38PM     CLINICAL DATA:  History of multiple myeloma; concluded chemotherapy  treatment today's semi: History of CHF. ; no history of tobacco use  semi: Subsequent visit    EXAM:  METASTATIC BONE SURVEY    COMPARISON:  Metastatic bone survey of January 11, 2014    FINDINGS:  The calvarium is intact. Subtle frontal lucencies are stable and  likely physiologic.    The lungs are well-expanded. The interstitial markings of both lungs  are more conspicuous today than on the previous study. There is new  bibasilar atelectasis, and there is a new small right pleural  effusion. There is old deformity of the posterior aspect of the left  seventh rib. The Port-A-Cath appliance tip overlies the proximal  portion of the SVC. The cardiac silhouette is mildly enlarged and  has increased slightly in size since the previous study. The  pulmonary vascularity is not engorged. There is tortuosityof the  descending thoracic aorta. There are stable calcified AP window  lymph nodes.    The cervical, thoracic, and lumbar spines exhibit mild degenerative  change. There is stable mild anterior wedging of approximately T8.  There is also stable mild superior endplate depression of L1. No  lytic or blastic vertebral body lesions are  demonstrated. The bony  pelvis exhibits mild osteopenia. There is are no lytic or blastic  lesions.    There are subtle lucencies within the mid and distal shafts ofthe  left ulna and distal right radius. No humeral lesions are  demonstrated. The visualized portions of the lower extremities  exhibit no discrete lytic or blastic bony lesions.     IMPRESSION:  1. There are subtle lucencies within the mid and distal diaphysis of  the left ulna and in the distal diaphysis of the right radius which  may reflect myelomatous lesions. These findings were not included in  the field of view on the previous study.  2. There is new bibasilar atelectasis, small right pleural effusion,  and bilaterally increased pulmonary interstitial markings. This may  reflect mild CHF. Superimposed acute bronchitic change is not  excluded. There is evidence of previous granulomatous infection.  3. There are mild degenerative changes of the spine. There is stable  mild loss of height of the bodies of T8 and L1.      Electronically Signed    By: David  [place]    On: 04/04/2014 15:22         Verified By: DAVID A. [place], M.D., MD   Pertinent Past History:  Pertinent Past History Multiple Myeloma Atrial fibrillation Hypertension Prostate cancer Anxiety   Hospital Course:  Hospital Course Patient was admitted for generalized weakness, neutropenia and Left lower Lobe Pneumonia. Cough and chest congestion improved gradually with Levaquin and Zosyn. He was evaluated by Oncology and received Neupogen for neutropenia. Chemotherapy is on hold for now. Hyponatremia and hypotension improved with gentle hydration. Chronic atrial fibrillation with controlled ventricular response rate. Eliquis was continued. ECHO revealed EF 40-45% with mild to moderate TR, Mild to moderate MR amd Mild AR. Citalopram was continued for Anxiety and Xanax was added as needed. He received Physical Therapy during his stay. Patient is  feeling much better overall. Persistent neutropenia, received Neulasta today, cleared by Hem-oncology for discharge. Shall continue Levaquin. [facility] is not available at [facility]. It is back ordered for two months. Patient came from [facility]. Shall discharge to [facility] rehab.   Condition on Discharge Guarded   DISCHARGE INSTRUCTIONS HOME MEDS:  Medication Reconciliation: Patient's Home Medications at Discharge:     Medication Instructions  metoprolol succinate 50 mg oral tablet, extended release  1.5 tab(s) orally once a day   citalopram 10 mg oral tablet  1 tab(s) orally once a day (at bedtime)   cyanocobalamin 2500 mcg sublingual tablet  1 tab(s) sublingual once a day   dexamethasone 4 mg oral tablet  5 tab(s) orally once a week on Tuesday   docusate sodium 100 mg oral capsule  1 cap(s) orally 2 times a day   eliquis 5 mg oral tablet  1 tab(s) orally 2 times a day   ferrous sulfate 325 mg oral tablet  1 tab(s) orally once a day   mirabegron 25 mg oral tablet, extended release  1 tab(s) orally once a day (at bedtime)   zolpidem 5 mg oral tablet  1 tab(s) orally once a day (at bedtime)   claritin 10 mg oral tablet  1 tab(s) orally once a day   alprazolam 0.25 mg oral tablet  1 tab(s) orally 2 times a day   ranitidine 150 mg oral tablet  1 tab(s) orally every 12 hours   line flush - normal saline  5 milliliter(s) injectable once a day, As Needed -line patency   line flush heparin 100 units/ml injection  500 unit(s) injectable every 72 hours   levofloxacin  750 milligram(s) orally every 24 hours   acetaminophen 325 mg oral tablet  2 tab(s) orally every 4 hours, As needed, mild pain (1-3/10) or temp. greater than 100.4    STOP TAKING THE FOLLOWING MEDICATION(S):    hydrochlorothiazide 25 mg oral tablet: 1 tab(s) orally once a day, As Needed for fluid benicar 40 mg oral tablet: 1 tab(s) orally once a day clonazepam 0.5 mg oral tablet: 1 tab(s) orally once a day (at bedtime), As Needed  - for Anxiety, Nervousness acetaminophen-hydrocodone 325 mg-5 mg oral tablet: 1 tab(s) orally every 8 hours, As Needed - for Pain oxycodone extended release 10 mg oral tablet, extended release: 1 tab(s) orally every 12 hours, As Needed - for Pain pomalyst 4 mg oral capsule: 1 cap(s) orally once a day potassium chloride 10 meq oral tablet, extended release: 1 tab(s) orally 2 times a day revlimid 10 mg oral capsule: 1 cap(s) orally once a day for 21 days, then 7 days off  Physician's Instructions:  Diet Low Sodium   Activity Limitations As tolerated   Return to Work Not Applicable   Time frame for Follow Up Appointment 1-2 weeks  Dr. Grayland Ormond   Time frame for Follow Up Appointment 1-2 weeks  Dr. Dwyane Luo, Yevette Knust(Attending Physician): [facility], [address], [place], [place] [address], [place], Timothy(Consultant): [facility], [address], [place], [place] [address], [place], [place] [address], [place] [phone]  Electronic Signatures: Glendon Axe (MD)  (Signed 01-Dec-15 16:50)  Authored: ADMISSION DATE AND DIAGNOSIS, CHIEF COMPLAINT/HPI, Allergies, PERTINENT LABS, PERTINENT RADIOLOGY STUDIES, PERTINENT PAST HISTORY, HOSPITAL COURSE, DISCHARGE INSTRUCTIONS HOME MEDS, PATIENT INSTRUCTIONS, Follow Up Physician   Last Updated: 01-Dec-15 16:50 by Glendon Axe (MD)

## 2014-10-22 NOTE — Consult Note (Signed)
Patient's ANC is now greater than 1000 and Neupogen has been discontinued. No further intervention needed from hematology or oncology standpoint at this time. Patient's chemotherapy will be temporarily on hold until his performance status improves. He will likely need rehabilitation upon discharge. Will schedule appropriate follow-up in 1-2 weeks. continue to follow.  Electronic Signatures: Gerarda FractionFinnegan, Timothy (MD)  (Signed on 29-Nov-15 08:16)  Authored  Last Updated: 29-Nov-15 08:16 by Gerarda FractionFinnegan, Timothy (MD)

## 2014-11-02 ENCOUNTER — Ambulatory Visit: Payer: Self-pay | Admitting: Internal Medicine

## 2015-04-01 IMAGING — CR METASTATIC BONE SURVEY
1 series · 13 of 16 positions shown · non-contrast
Comparison: None.

CLINICAL DATA: Multiple myeloma

EXAM:
METASTATIC BONE SURVEY

[Series 1: w skull lat · 0.14mm/px · 13 of 19 slices shown]
[im 1/19]
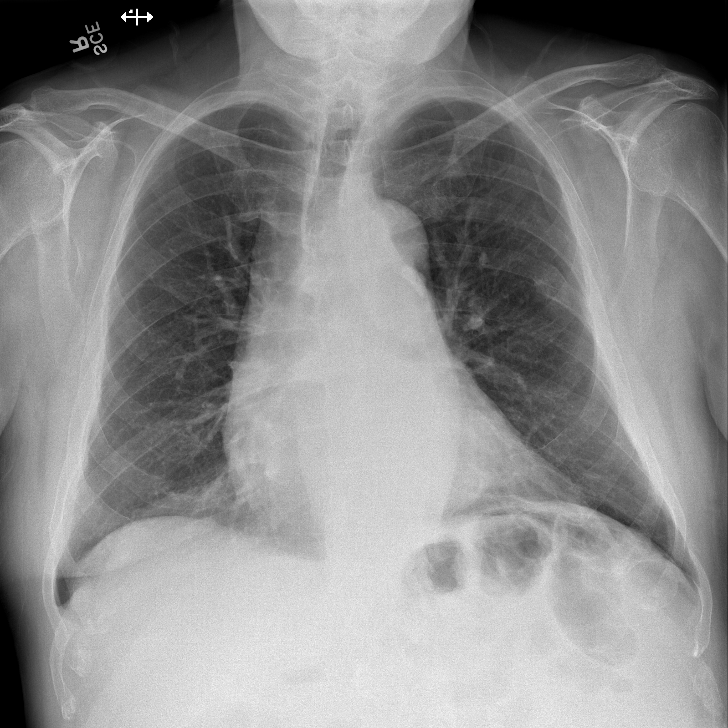
[im 2/19]
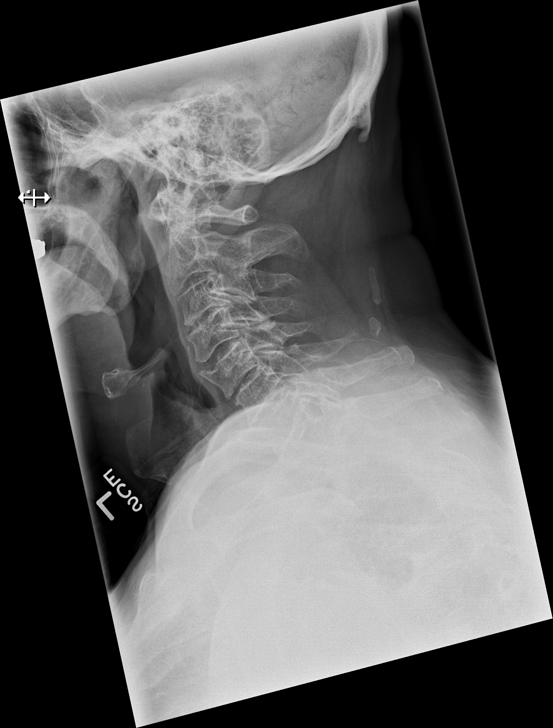
[im 4/19]
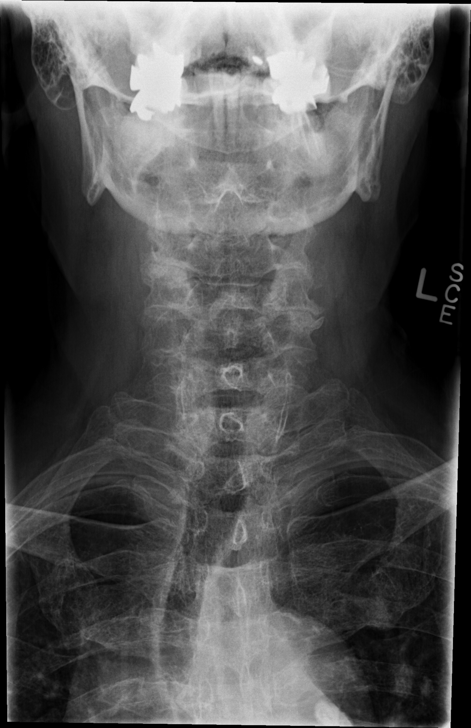
[im 5/19]
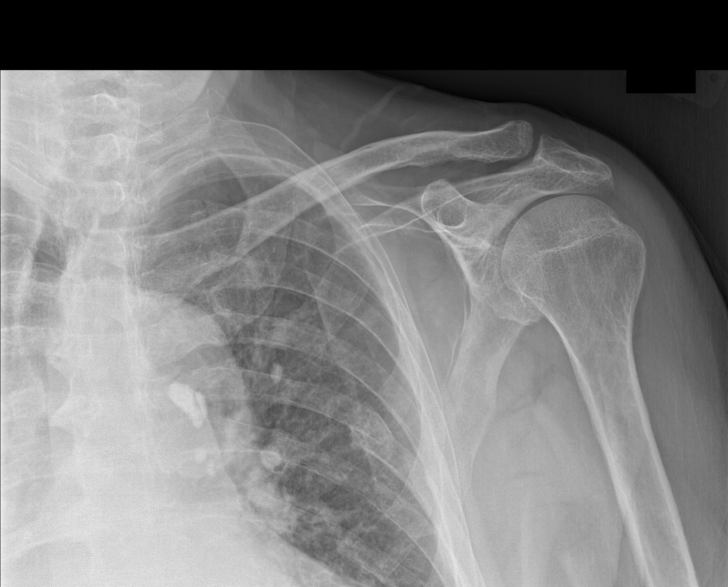
[im 7/19]
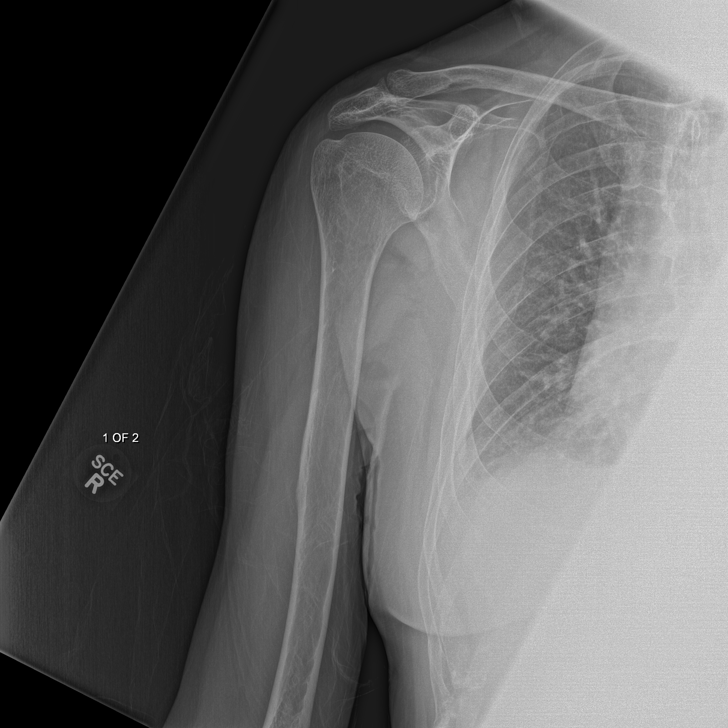
[im 8/19]
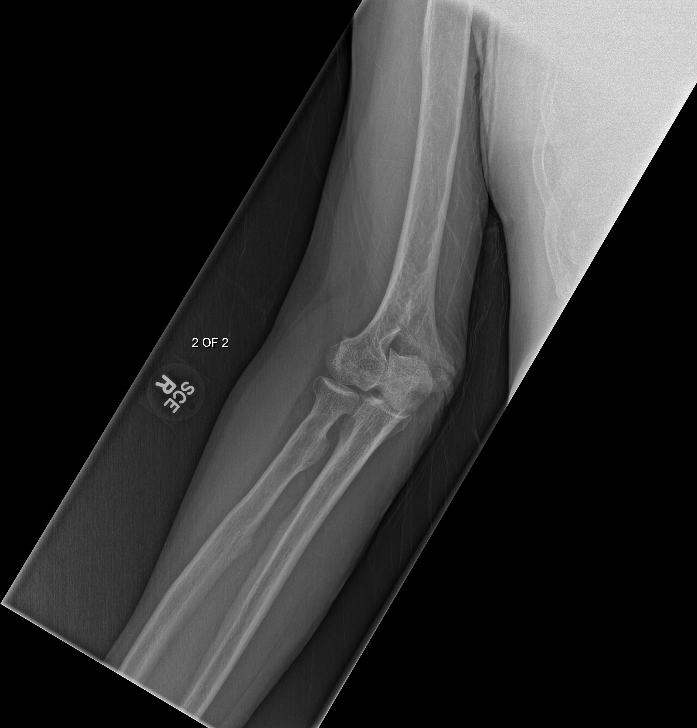
[im 10/19]
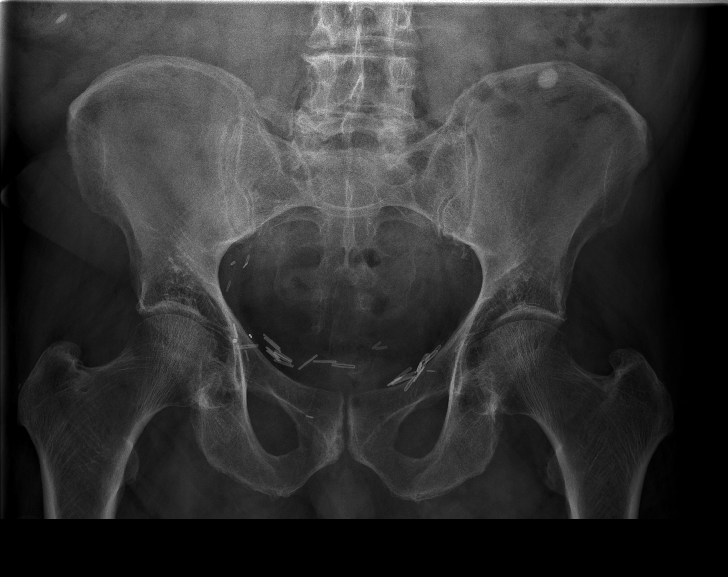
[im 11/19]
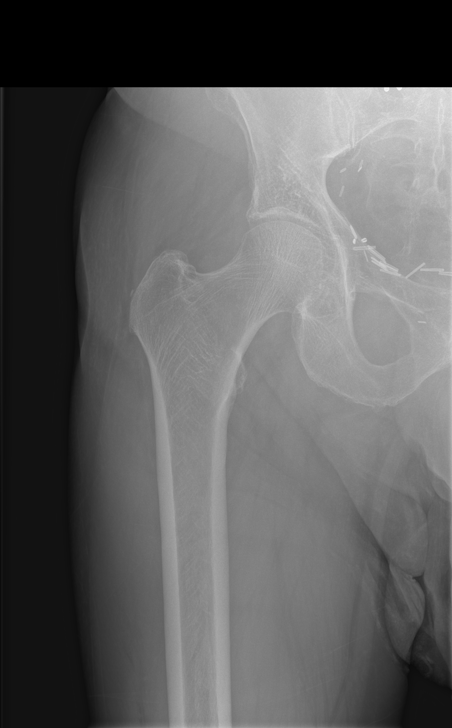
[im 13/19]
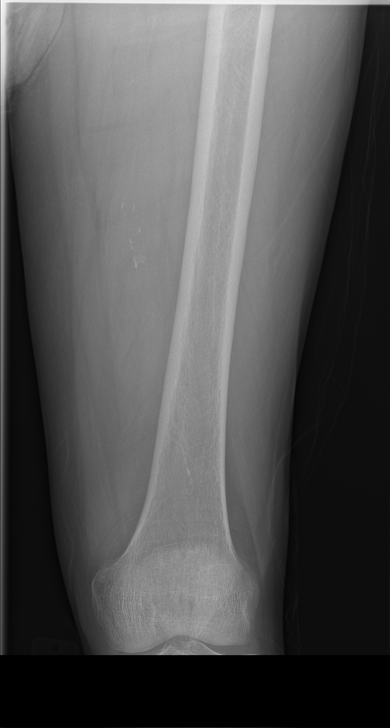
[im 14/19]
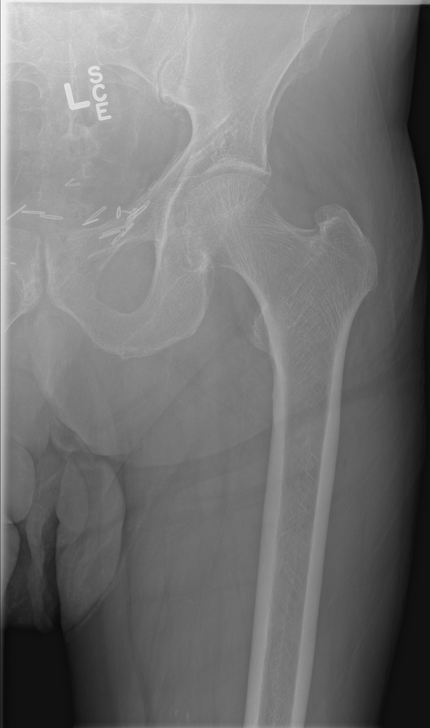
[im 15/19]
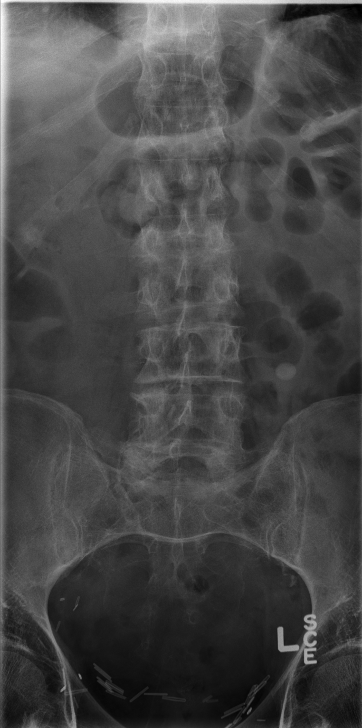
[im 17/19]
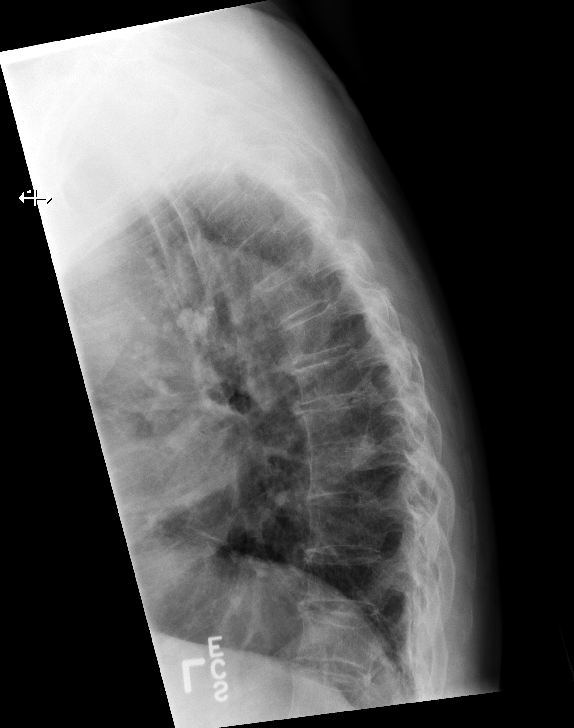
[im 19/19]
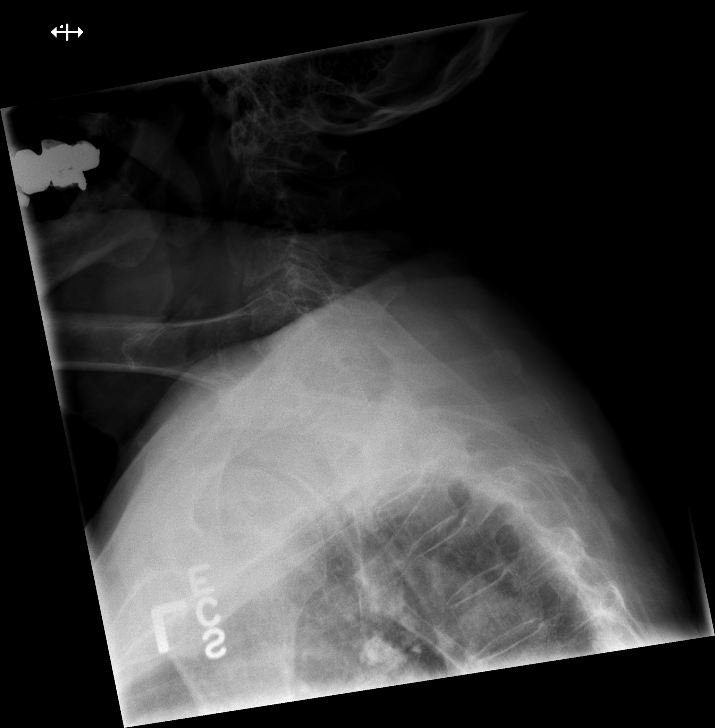

[13 of 16 positions shown; findings below may reference images not displayed]

FINDINGS: There are several small lucencies in the skull that have relatively
ill-defined margins. These is most likely arachnoid granulations or
small venous lakes.

There are no well-defined lucent lesions to suggest multiple
myeloma. There are no osteoblastic lesions.

Bones are diffusely demineralized. There are degenerative changes
noted along the thoracolumbar spine. Surgical vascular clips lie in
the low pelvis consistent with a previous prostatectomy.
Calcifications are noted along the AP window and left hilum
consistent with healed granulomatous disease. There is an old healed
fracture of the left posterior seventh rib.
IMPRESSION: 1. No evidence of multiple myeloma or other neoplastic disease to
bone.

## 2015-06-23 IMAGING — CR METASTATIC BONE SURVEY
1 series · 10 of 10 positions shown · non-contrast
Comparison: Metastatic bone survey January 11, 2014

CLINICAL DATA: History of multiple myeloma; concluded chemotherapy
treatment today's semi: History of CHF. ; no history of tobacco use
semi: Subsequent visit

EXAM:
METASTATIC BONE SURVEY

[Series 1: dxr bone survey metastatic · 0.14mm/px · 10 of 22 slices shown]
[im 1/22]
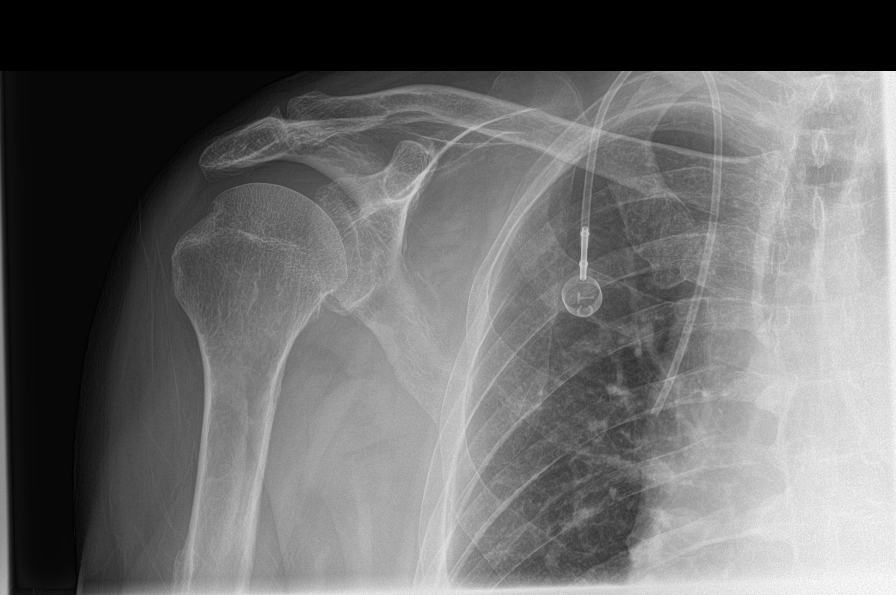
[im 3/22]
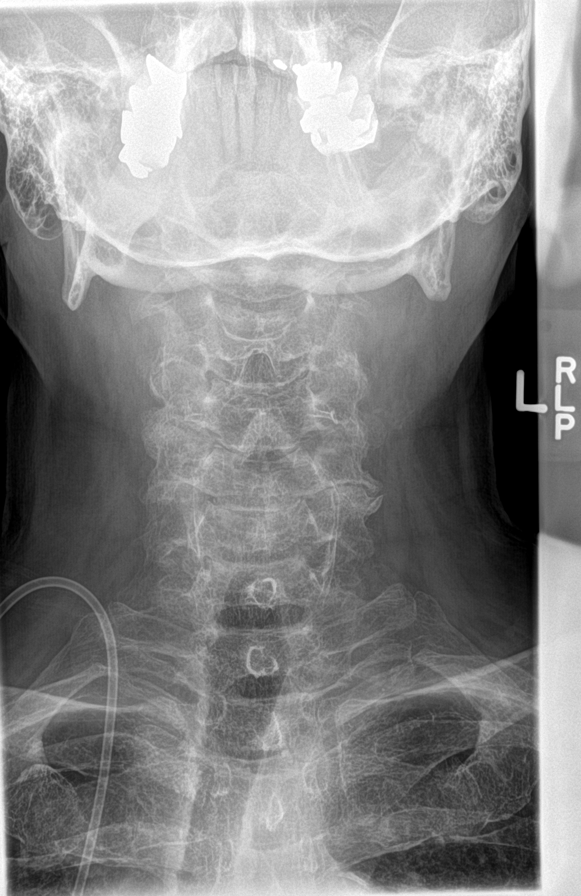
[im 5/22]
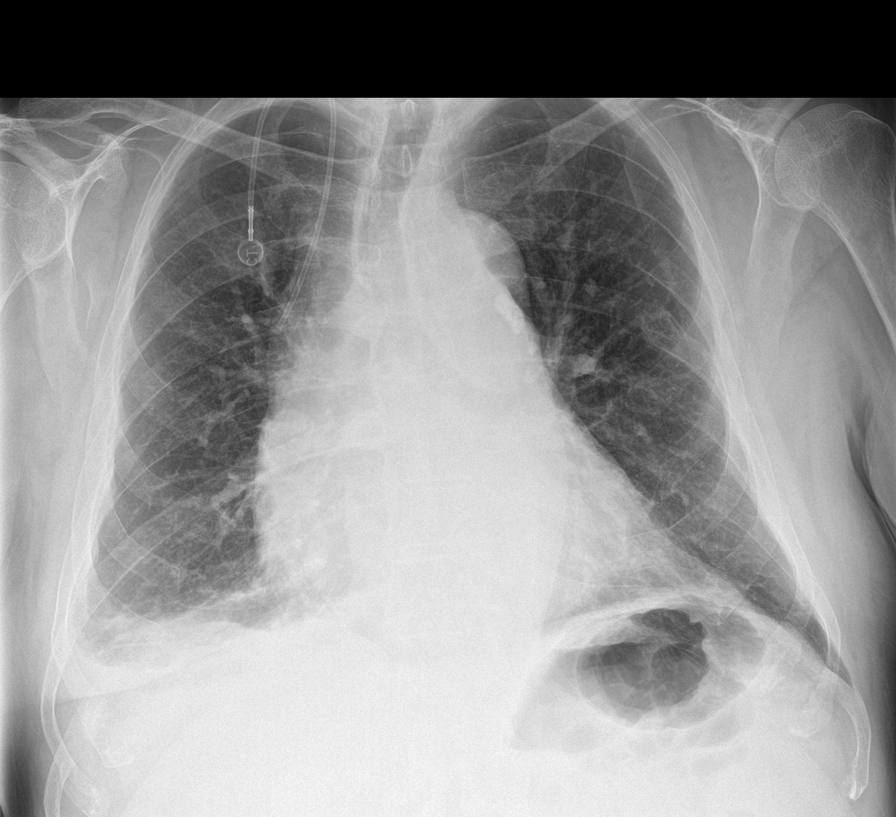
[im 8/22]
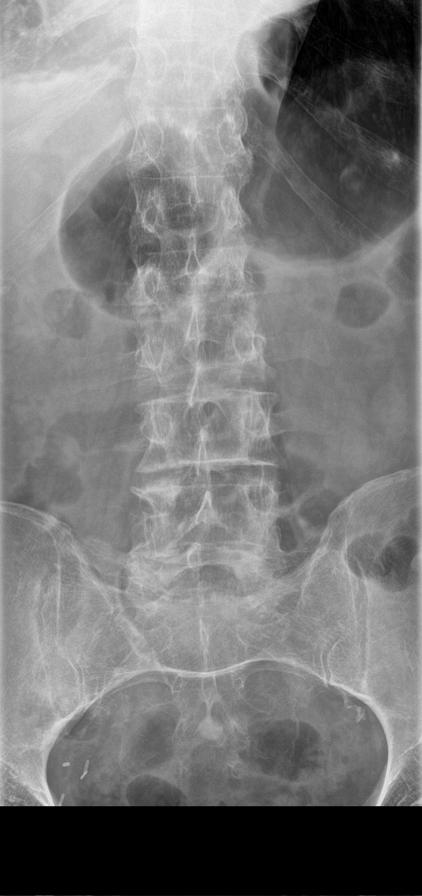
[im 10/22]
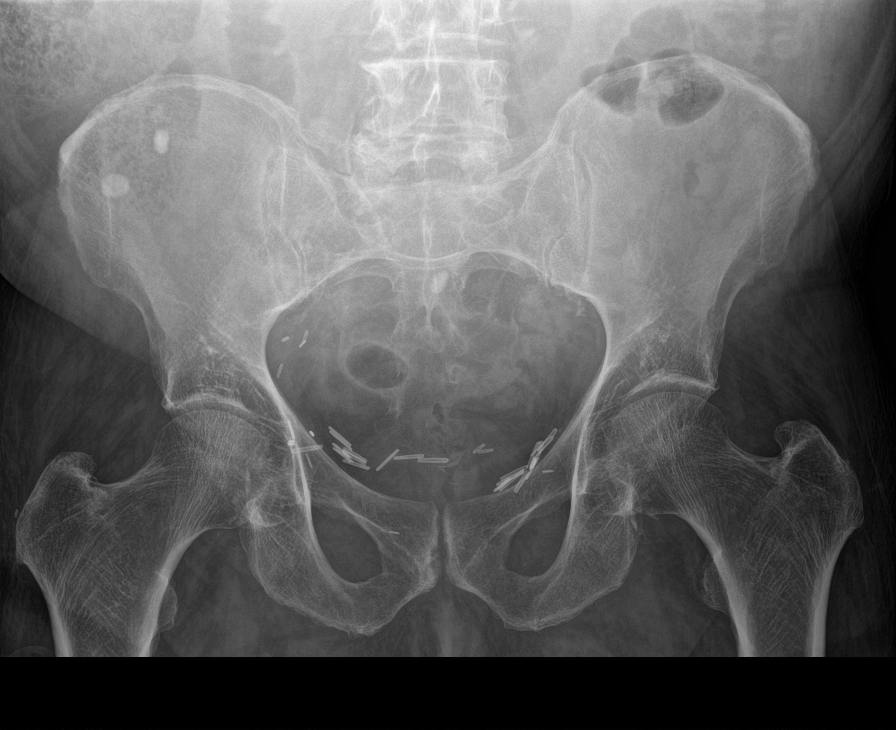
[im 12/22]
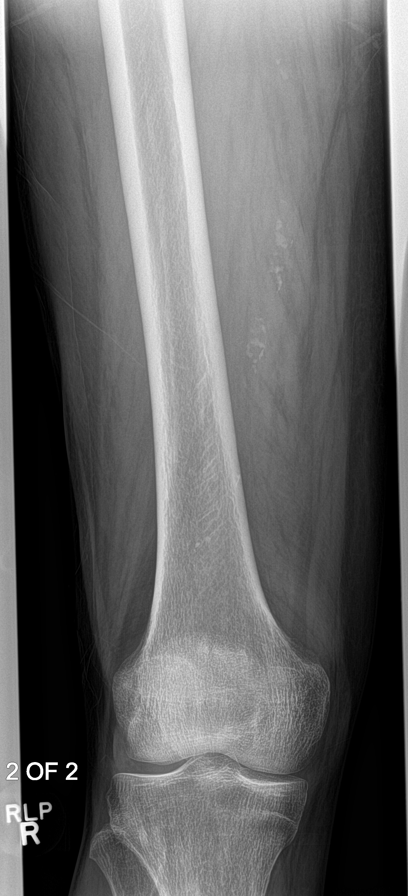
[im 15/22]
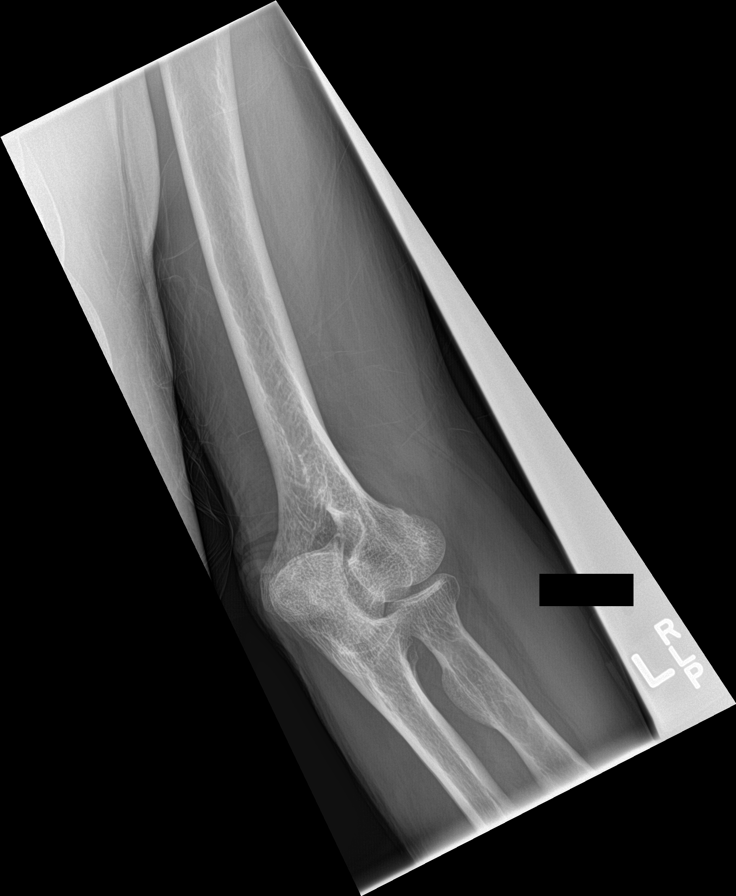
[im 17/22]
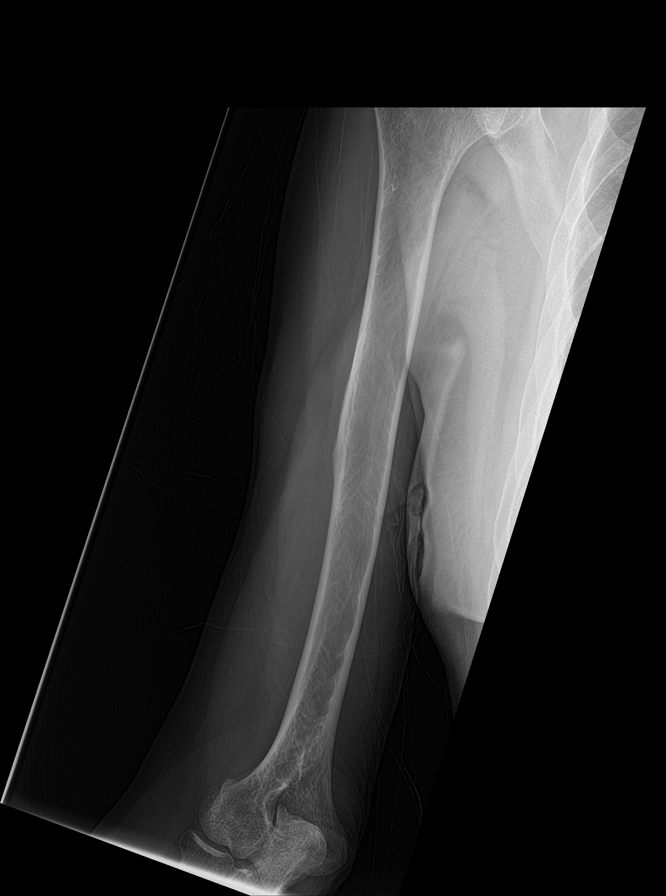
[im 19/22]
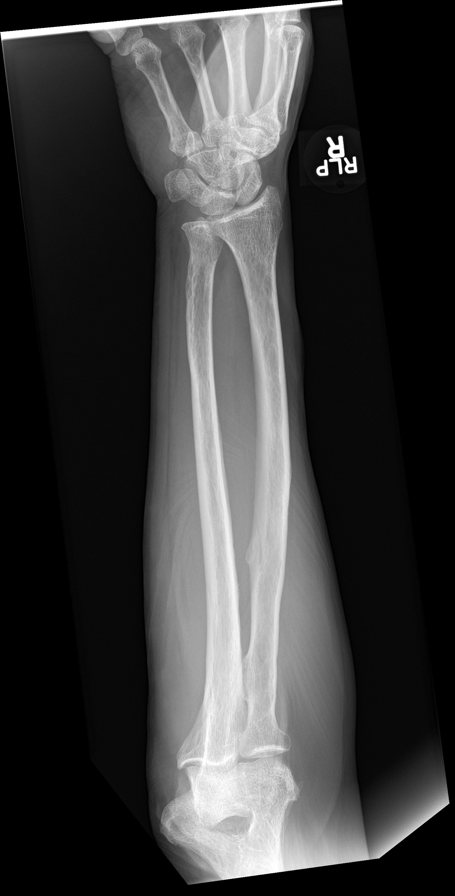
[im 22/22]
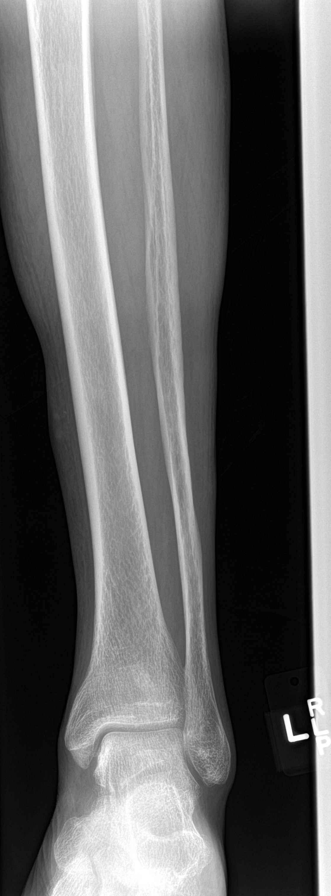

[10 of 10 positions shown; findings below may reference images not displayed]

FINDINGS: The calvarium is intact. Subtle frontal lucencies are stable and
likely physiologic.

The lungs are well-expanded. The interstitial markings of both lungs
are more conspicuous today than on the previous study. There is new
bibasilar atelectasis, and there is a new small right pleural
effusion. There is old deformity of the posterior aspect of the left
seventh rib. The Port-A-Cath appliance tip overlies the proximal
portion of the SVC. The cardiac silhouette is mildly enlarged and
has increased slightly in size since the previous study. The
pulmonary vascularity is not engorged. There is tortuosity of the
descending thoracic aorta. There are stable calcified AP window
lymph nodes.

The cervical, thoracic, and lumbar spines exhibit mild degenerative
change. There is stable mild anterior wedging of approximately T8.
There is also stable mild superior endplate depression of L1. No
lytic or blastic vertebral body lesions are demonstrated. The bony
pelvis exhibits mild osteopenia. There is are no lytic or blastic
lesions.

There are subtle lucencies within the mid and distal shafts of the
left ulna and distal right radius. No humeral lesions are
demonstrated. The visualized portions of the lower extremities
exhibit no discrete lytic or blastic bony lesions.
IMPRESSION: 1. There are subtle lucencies within the mid and distal diaphysis of
the left ulna and in the distal diaphysis of the right radius which
may reflect myelomatous lesions. These findings were not included in
the field of view on the previous study.
2. There is new bibasilar atelectasis, small right pleural effusion,
and bilaterally increased pulmonary interstitial markings. This may
reflect mild CHF. Superimposed acute bronchitic change is not
excluded. There is evidence of previous granulomatous infection.
3. There are mild degenerative changes of the spine. There is stable
mild loss of height of the bodies of T8 and L1.

## 2015-08-15 IMAGING — CR DG CHEST 2V
1 series · 2 of 2 positions shown · non-contrast
Comparison: None.

CLINICAL DATA: Increased weakness.

EXAM:
CHEST  2 VIEW

[Series 1: dxr chest pa (or ap) and lateral · 0.14mm/px · 2 of 2 slices shown]
[im 1/2]
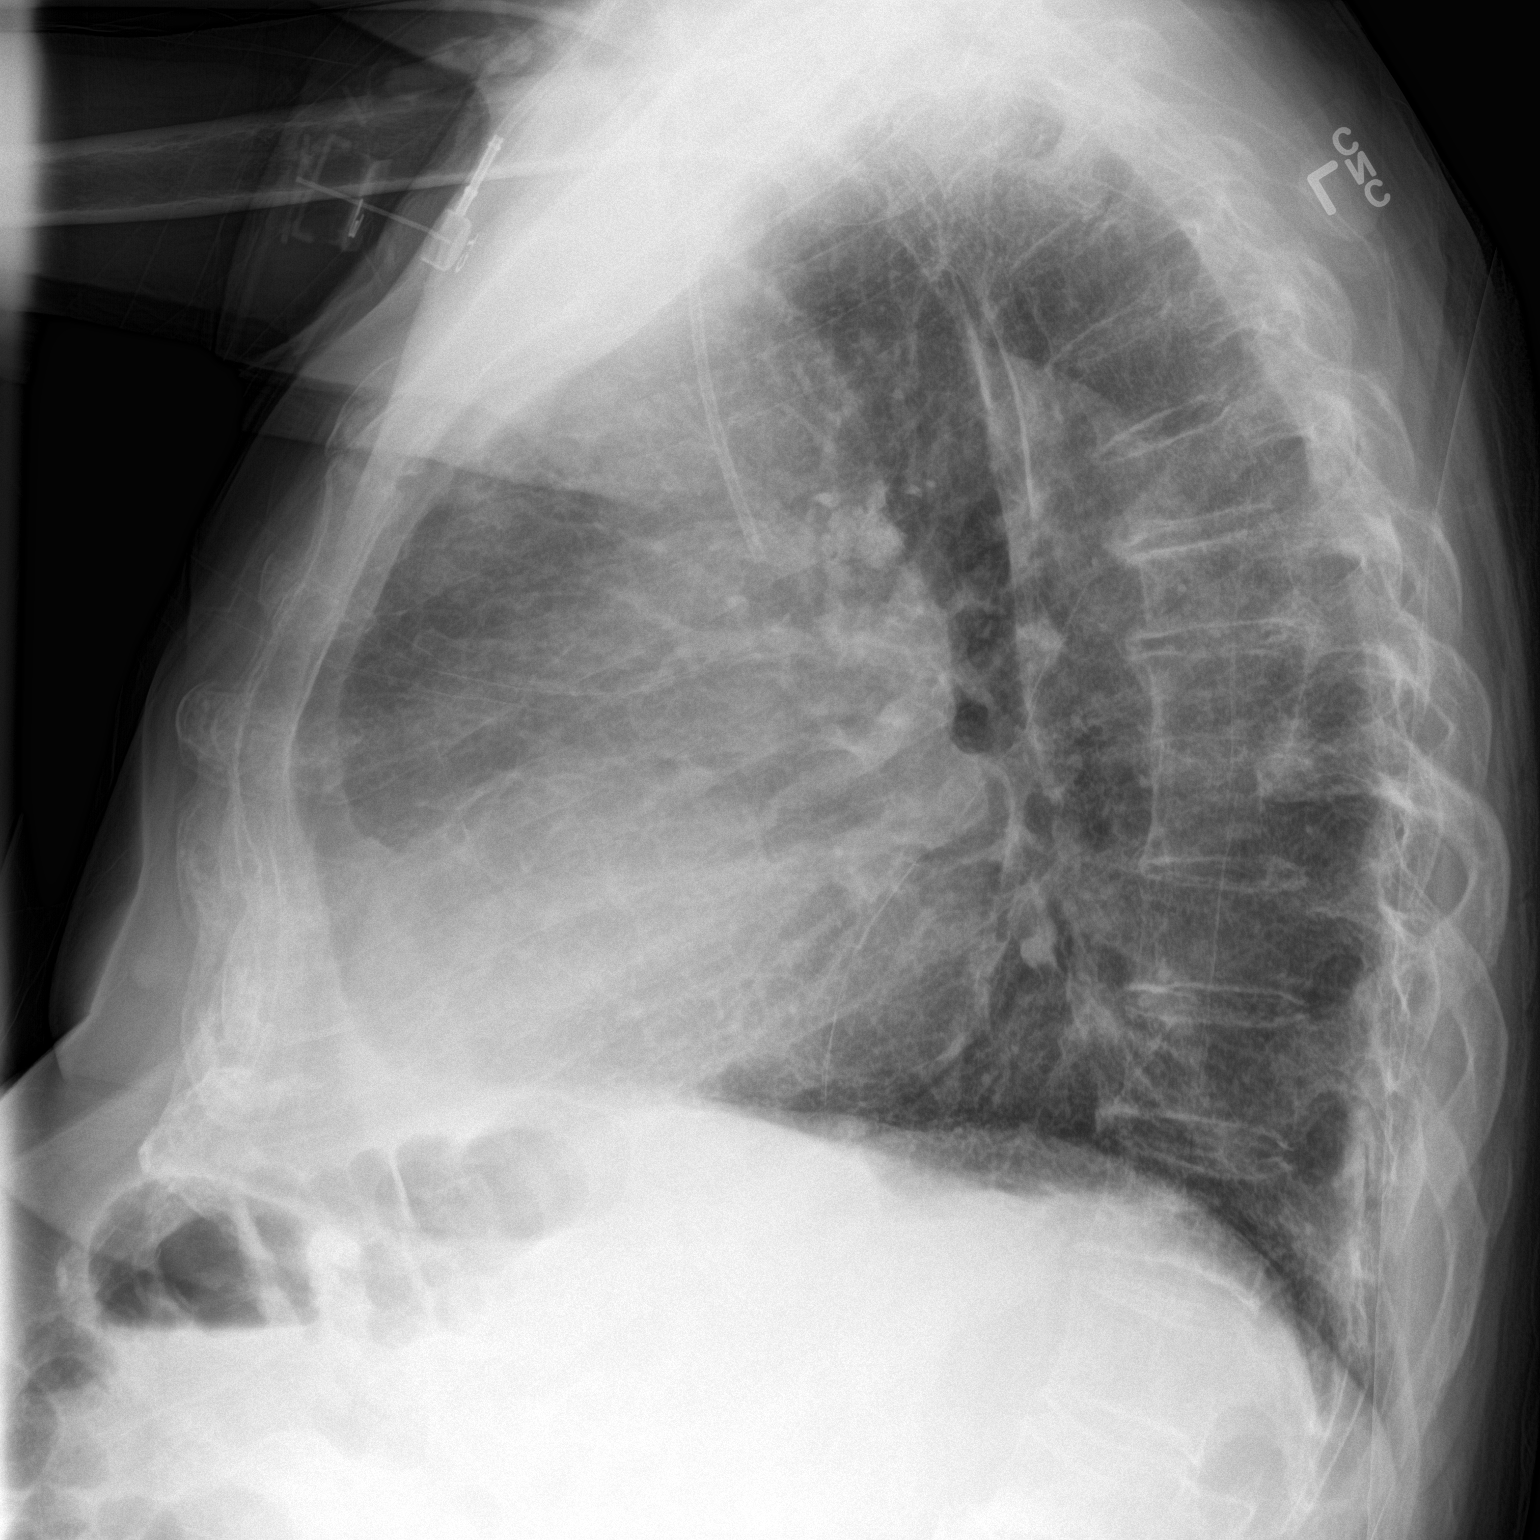
[im 2/2]
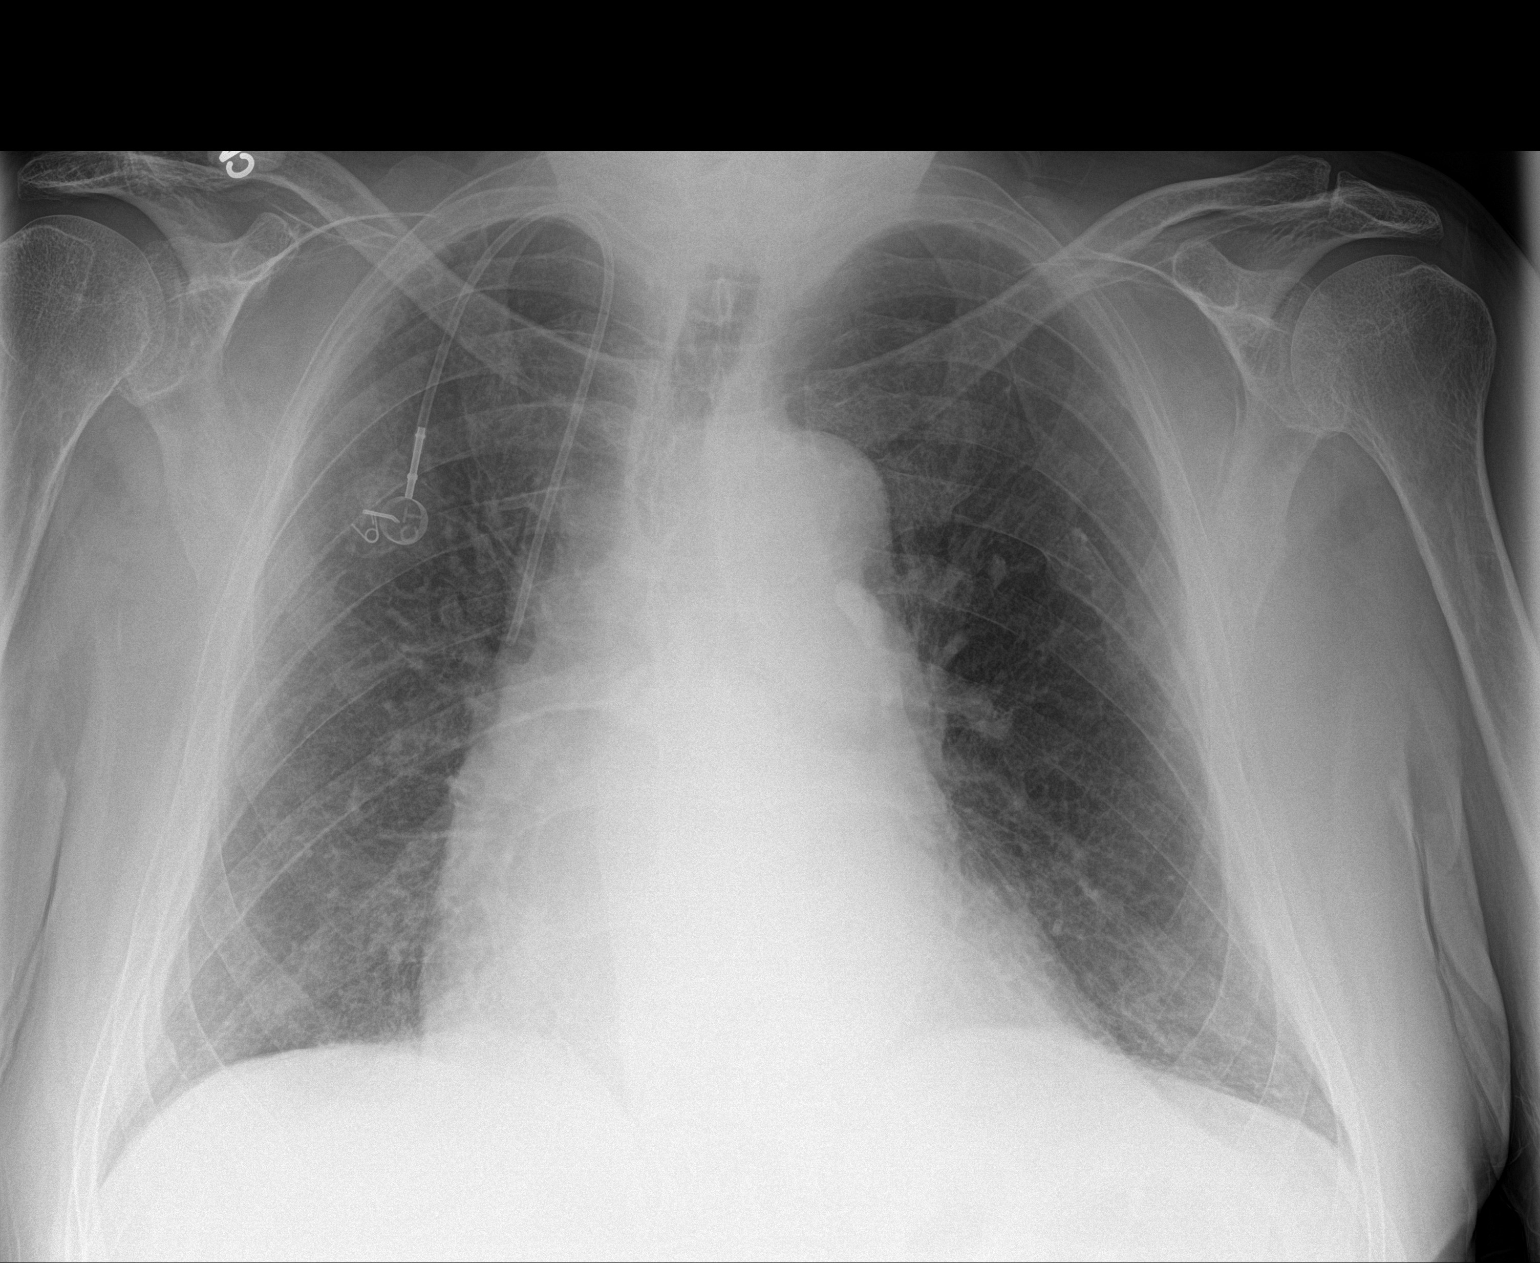

[2 of 2 positions shown; findings below may reference images not displayed]

FINDINGS: Port-A-Cath in good anatomic position. Cardiomegaly. Normal
pulmonary vascularity. Mild mild left base subsegmental axis, mild
infiltrate cannot be excluded. Cardiomegaly with normal pulmonary
vascularity . Calcified left hilar lymph node. Old left rib
fracture.
IMPRESSION: 1.  Mild left base atelectasis, mild infiltrate cannot be excluded.

2.  Cardiomegaly, no pulmonary venous congestion.

3.  Port-A-Cath in good anatomic position.
